# Patient Record
Sex: Male | Born: 1945 | Race: White | Hispanic: No | Marital: Married | Smoking: Former smoker
Health system: Southern US, Community
[De-identification: ages and names within clinical notes are randomized; demographics above are authoritative.]

## PROBLEM LIST (undated history)

## (undated) DIAGNOSIS — N289 Disorder of kidney and ureter, unspecified: Secondary | ICD-10-CM

## (undated) DIAGNOSIS — M109 Gout, unspecified: Secondary | ICD-10-CM

## (undated) DIAGNOSIS — I639 Cerebral infarction, unspecified: Secondary | ICD-10-CM

## (undated) DIAGNOSIS — E119 Type 2 diabetes mellitus without complications: Secondary | ICD-10-CM

## (undated) DIAGNOSIS — I10 Essential (primary) hypertension: Secondary | ICD-10-CM

## (undated) DIAGNOSIS — I2510 Atherosclerotic heart disease of native coronary artery without angina pectoris: Secondary | ICD-10-CM

## (undated) HISTORY — PX: CAROTID STENT: SHX1301

## (undated) HISTORY — PX: HEMORRHOID SURGERY: SHX153

---

## 2002-07-04 ENCOUNTER — Emergency Department (HOSPITAL_COMMUNITY): Admission: EM | Admit: 2002-07-04 | Discharge: 2002-07-04 | Payer: Self-pay | Admitting: Emergency Medicine

## 2010-05-05 ENCOUNTER — Ambulatory Visit: Payer: Self-pay | Admitting: Cardiology

## 2013-11-01 ENCOUNTER — Emergency Department (HOSPITAL_COMMUNITY)
Admission: EM | Admit: 2013-11-01 | Discharge: 2013-11-01 | Disposition: A | Discharge status: Home / Self Care | Payer: Medicare Other | Attending: Emergency Medicine | Admitting: Emergency Medicine

## 2013-11-01 ENCOUNTER — Encounter (HOSPITAL_COMMUNITY): Payer: Self-pay | Admitting: Emergency Medicine

## 2013-11-01 DIAGNOSIS — Z87891 Personal history of nicotine dependence: Secondary | ICD-10-CM | POA: Insufficient documentation

## 2013-11-01 DIAGNOSIS — E119 Type 2 diabetes mellitus without complications: Secondary | ICD-10-CM | POA: Insufficient documentation

## 2013-11-01 DIAGNOSIS — I10 Essential (primary) hypertension: Secondary | ICD-10-CM | POA: Insufficient documentation

## 2013-11-01 DIAGNOSIS — Z87448 Personal history of other diseases of urinary system: Secondary | ICD-10-CM | POA: Insufficient documentation

## 2013-11-01 DIAGNOSIS — I2510 Atherosclerotic heart disease of native coronary artery without angina pectoris: Secondary | ICD-10-CM | POA: Insufficient documentation

## 2013-11-01 HISTORY — DX: Type 2 diabetes mellitus without complications: E11.9

## 2013-11-01 HISTORY — DX: Gout, unspecified: M10.9

## 2013-11-01 HISTORY — DX: Disorder of kidney and ureter, unspecified: N28.9

## 2013-11-01 HISTORY — DX: Essential (primary) hypertension: I10

## 2013-11-01 HISTORY — DX: Atherosclerotic heart disease of native coronary artery without angina pectoris: I25.10

## 2013-11-01 MED ORDER — CLONIDINE HCL 0.1 MG PO TABS
0.1000 mg | ORAL_TABLET | Freq: Once | ORAL | Status: AC
  Administered 2013-11-01: 0.1 mg via ORAL
  Filled 2013-11-01: qty 1

## 2013-11-01 MED ORDER — SODIUM CHLORIDE 0.9 % IV SOLN: Freq: Once | INTRAVENOUS | Status: DC

## 2013-11-01 MED ORDER — CLONIDINE HCL 0.1 MG PO TABS: ORAL_TABLET | ORAL | Status: DC

## 2013-11-01 NOTE — ED Provider Notes (Signed)
CSN: [id number]     Arrival date & time 11/01/13  1740 History   First MD Initiated Contact with Patient 11/01/13 1748    This chart was scribed for Maudry Diego, MD by Anastasia Pall, ED Scribe. This patient was seen in room APA02/APA02 and the patient's care was started at 6:11 PM.  Chief Complaint  Patient presents with  . Hypertension   Patient is a 67 y.o. male presenting with hypertension. The history is provided by the patient. No language interpreter was used.  Hypertension This is a chronic problem. The current episode started 6 to 12 hours ago. The problem occurs constantly. The problem has been gradually improving. Nothing aggravates the symptoms. The symptoms are relieved by medications.   HPI Comments: Aaron Bonilla is a 67 y.o. male with a h/o HTN for 10+ years, who presents to the Emergency Department complaining of a sudden spike in his blood pressure, onset earlier today. He reports he saw his PCP earlier today, who sent him to the ER for HTN evaluation. He reports he was there to have a Rx refill. His BP was reported at 216/114 at the PCP office and was given 50 mg Hydralazine. He denies ever being out of blood pressure medicine. He reports taking Enalopril twice a day. He states while at the PCP they told him they needed to switch blood pressure medicine, but is unsure what they are switching it to. He denies any associated symptoms and any other complaints.  PCP -No primary provider on file.  Past Medical History  Diagnosis Date  . Diabetes mellitus without complication   . Hypertension   . Coronary artery disease   . Renal disorder   . Gout    Past Surgical History  Procedure Laterality Date  . Carotid stent    . Hemorrhoid surgery     No family history on file. History  Substance Use Topics  . Smoking status: Former Research scientist (life sciences)  . Smokeless tobacco: Not on file  . Alcohol Use: No    Review of Systems  Cardiovascular:       Positive for high blood pressure.   All other systems reviewed and are negative.   Allergies  Niacin and related and Oxycodone  Home Medications  No current outpatient prescriptions on file.  Triage Vitals: BP 221/114  Pulse 74  Ht 5\' 11"  (1.803 m)  Wt 209 lb 8 oz (95.029 kg)  BMI 29.23 kg/m2  SpO2 97%  Physical Exam  Constitutional: He is oriented to person, place, and time. He appears well-developed.  HENT:  Head: Normocephalic.  Eyes: Conjunctivae and EOM are normal. No scleral icterus.  Neck: Neck supple. No thyromegaly present.  Cardiovascular: Normal rate and regular rhythm.  Exam reveals no gallop and no friction rub.   No murmur heard. Pulmonary/Chest: No stridor. He has no wheezes. He has no rales. He exhibits no tenderness.  Abdominal: He exhibits no distension. There is no tenderness. There is no rebound.  Musculoskeletal: Normal range of motion. He exhibits no edema.  Lymphadenopathy:    He has no cervical adenopathy.  Neurological: He is oriented to person, place, and time. He exhibits normal muscle tone. Coordination normal.  Skin: No rash noted. No erythema.  Psychiatric: He has a normal mood and affect. His behavior is normal.    ED Course  Procedures (including critical care time)  DIAGNOSTIC STUDIES: Oxygen Saturation is 97% on room air, normal by my interpretation.    COORDINATION OF CARE: 6:14  PM-Discussed treatment plan which includes EKG, Cardiac monitoring, sodium chloride infusion, and Catapres with pt at bedside and pt agreed to plan.   Labs Review Labs Reviewed - No data to display Imaging Review No results found.  EKG Interpretation   None      Meds ordered this encounter  Medications  . 0.9 %  sodium chloride infusion    Sig:   . cloNIDine (CATAPRES) tablet 0.1 mg    Sig:     MDM  No diagnosis found.      The chart was scribed for me under my direct supervision.  I personally performed the history, physical, and medical decision making and all procedures  in the evaluation of this patient.Maudry Diego, MD 11/01/13 2001

## 2013-11-01 NOTE — ED Notes (Signed)
Pt here from PCP for evaluation of hypertension. States he was at the doctors office to get his medication refilled. BP  216/114 office. Was given hydralazine 50 mg in office.

## 2019-12-25 ENCOUNTER — Other Ambulatory Visit (HOSPITAL_COMMUNITY): Payer: Self-pay | Admitting: Ophthalmology

## 2019-12-25 ENCOUNTER — Other Ambulatory Visit: Payer: Self-pay | Admitting: Ophthalmology

## 2019-12-25 DIAGNOSIS — H3582 Retinal ischemia: Secondary | ICD-10-CM

## 2020-01-25 ENCOUNTER — Encounter: Payer: Self-pay | Admitting: Intensive Care

## 2020-01-25 ENCOUNTER — Other Ambulatory Visit: Payer: Self-pay

## 2020-01-25 ENCOUNTER — Emergency Department: Payer: Medicare HMO

## 2020-01-25 ENCOUNTER — Emergency Department
Admission: EM | Admit: 2020-01-25 | Discharge: 2020-01-26 | Disposition: A | Discharge status: Home / Self Care | Payer: Medicare HMO | Attending: Emergency Medicine | Admitting: Emergency Medicine

## 2020-01-25 ENCOUNTER — Ambulatory Visit
Admission: RE | Admit: 2020-01-25 | Discharge: 2020-01-25 | Disposition: A | Discharge status: Home / Self Care | Payer: Medicare HMO | Source: Ambulatory Visit | Attending: Ophthalmology | Admitting: Ophthalmology

## 2020-01-25 DIAGNOSIS — Z79899 Other long term (current) drug therapy: Secondary | ICD-10-CM | POA: Diagnosis not present

## 2020-01-25 DIAGNOSIS — R9389 Abnormal findings on diagnostic imaging of other specified body structures: Secondary | ICD-10-CM | POA: Diagnosis present

## 2020-01-25 DIAGNOSIS — Z794 Long term (current) use of insulin: Secondary | ICD-10-CM | POA: Diagnosis not present

## 2020-01-25 DIAGNOSIS — I6523 Occlusion and stenosis of bilateral carotid arteries: Secondary | ICD-10-CM | POA: Diagnosis not present

## 2020-01-25 DIAGNOSIS — Z20822 Contact with and (suspected) exposure to covid-19: Secondary | ICD-10-CM | POA: Insufficient documentation

## 2020-01-25 DIAGNOSIS — Z7982 Long term (current) use of aspirin: Secondary | ICD-10-CM | POA: Diagnosis not present

## 2020-01-25 DIAGNOSIS — H538 Other visual disturbances: Secondary | ICD-10-CM | POA: Insufficient documentation

## 2020-01-25 DIAGNOSIS — E119 Type 2 diabetes mellitus without complications: Secondary | ICD-10-CM | POA: Insufficient documentation

## 2020-01-25 DIAGNOSIS — I2510 Atherosclerotic heart disease of native coronary artery without angina pectoris: Secondary | ICD-10-CM | POA: Diagnosis not present

## 2020-01-25 DIAGNOSIS — Z8673 Personal history of transient ischemic attack (TIA), and cerebral infarction without residual deficits: Secondary | ICD-10-CM | POA: Diagnosis not present

## 2020-01-25 DIAGNOSIS — H3582 Retinal ischemia: Secondary | ICD-10-CM

## 2020-01-25 DIAGNOSIS — I10 Essential (primary) hypertension: Secondary | ICD-10-CM | POA: Diagnosis not present

## 2020-01-25 DIAGNOSIS — Z87891 Personal history of nicotine dependence: Secondary | ICD-10-CM | POA: Diagnosis not present

## 2020-01-25 HISTORY — DX: Cerebral infarction, unspecified: I63.9

## 2020-01-25 LAB — CBC WITH DIFFERENTIAL/PLATELET
Abs Immature Granulocytes: 0.04 10*3/uL (ref 0.00–0.07)
Basophils Absolute: 0 10*3/uL (ref 0.0–0.1)
Basophils Relative: 0 %
Eosinophils Absolute: 0.2 10*3/uL (ref 0.0–0.5)
Eosinophils Relative: 2 %
HCT: 36.5 % — ABNORMAL LOW (ref 39.0–52.0)
Hemoglobin: 12.4 g/dL — ABNORMAL LOW (ref 13.0–17.0)
Immature Granulocytes: 0 %
Lymphocytes Relative: 21 %
Lymphs Abs: 2 10*3/uL (ref 0.7–4.0)
MCH: 27 pg (ref 26.0–34.0)
MCHC: 34 g/dL (ref 30.0–36.0)
MCV: 79.3 fL — ABNORMAL LOW (ref 80.0–100.0)
Monocytes Absolute: 0.4 10*3/uL (ref 0.1–1.0)
Monocytes Relative: 4 %
Neutro Abs: 7 10*3/uL (ref 1.7–7.7)
Neutrophils Relative %: 73 %
Platelets: 312 10*3/uL (ref 150–400)
RBC: 4.6 MIL/uL (ref 4.22–5.81)
RDW: 15.3 % (ref 11.5–15.5)
WBC: 9.7 10*3/uL (ref 4.0–10.5)
nRBC: 0 % (ref 0.0–0.2)

## 2020-01-25 LAB — COMPREHENSIVE METABOLIC PANEL
ALT: 15 U/L (ref 0–44)
AST: 17 U/L (ref 15–41)
Albumin: 3.4 g/dL — ABNORMAL LOW (ref 3.5–5.0)
Alkaline Phosphatase: 60 U/L (ref 38–126)
Anion gap: 14 (ref 5–15)
BUN: 37 mg/dL — ABNORMAL HIGH (ref 8–23)
CO2: 19 mmol/L — ABNORMAL LOW (ref 22–32)
Calcium: 8.5 mg/dL — ABNORMAL LOW (ref 8.9–10.3)
Chloride: 104 mmol/L (ref 98–111)
Creatinine, Ser: 2.58 mg/dL — ABNORMAL HIGH (ref 0.61–1.24)
GFR calc Af Amer: 27 mL/min — ABNORMAL LOW (ref >60)
GFR calc non Af Amer: 23 mL/min — ABNORMAL LOW (ref >60)
Glucose, Bld: 192 mg/dL — ABNORMAL HIGH (ref 70–99)
Potassium: 3.3 mmol/L — ABNORMAL LOW (ref 3.5–5.1)
Sodium: 137 mmol/L (ref 135–145)
Total Bilirubin: 0.5 mg/dL (ref 0.3–1.2)
Total Protein: 7.2 g/dL (ref 6.5–8.1)

## 2020-01-25 LAB — PROTIME-INR
INR: 1 (ref 0.8–1.2)
Prothrombin Time: 12.6 seconds (ref 11.4–15.2)

## 2020-01-25 LAB — APTT: aPTT: 34 seconds (ref 24–36)

## 2020-01-25 MED ORDER — CLOPIDOGREL BISULFATE 75 MG PO TABS
75.0000 mg | ORAL_TABLET | Freq: Once | ORAL | Status: AC
  Administered 2020-01-25: 23:00:00 75 mg via ORAL
  Filled 2020-01-25: qty 1

## 2020-01-25 MED ORDER — CLOPIDOGREL BISULFATE 75 MG PO TABS: 75.0000 mg | ORAL_TABLET | Freq: Every day | ORAL | 2 refills | Status: AC

## 2020-01-25 MED ORDER — LABETALOL HCL 5 MG/ML IV SOLN
20.0000 mg | Freq: Once | INTRAVENOUS | Status: AC
  Administered 2020-01-25: 22:00:00 20 mg via INTRAVENOUS
  Filled 2020-01-25: qty 4

## 2020-01-25 MED ORDER — SODIUM CHLORIDE 0.9 % IV BOLUS
1000.0000 mL | Freq: Once | INTRAVENOUS | Status: AC
  Administered 2020-01-25: 22:00:00 1000 mL via INTRAVENOUS

## 2020-01-25 MED ORDER — LABETALOL HCL 5 MG/ML IV SOLN: 20.0000 mg | Freq: Once | INTRAVENOUS | Status: DC

## 2020-01-25 NOTE — ED Notes (Signed)
Pt was taken to CT

## 2020-01-25 NOTE — ED Triage Notes (Addendum)
Patient sent by PCP after remarkable outpatient [place] of bilateral carotids. Denies any symptoms

## 2020-01-25 NOTE — ED Provider Notes (Signed)
[facility] Emergency Department Provider Note   ____________________________________________   First MD Initiated Contact with Patient 01/25/20 [phone]     (approximate)  I have reviewed the triage vital signs and the nursing notes.   HISTORY  Chief Complaint Carotid    HPI Aaron Bonilla is a 74 y.o. male with past medical history of CAD, hypertension, diabetes who presents to the ED for abnormal imaging results.  Patient reports that he was referred by his ophthalmologist to get an ultrasound of the blood vessels in his neck.  This was performed earlier today and concerning for occlusion of his right internal carotid artery and significant stenosis of his left internal carotid artery.  He was then told he needed to seek care in the ED.  He states he has been having a few months of blurry vision in his left eye, but denies any current numbness, weakness, or speech changes.  He does admit to an episode a few months ago where he had slurred speech and left-sided weakness, but did not seek care at that time.  He currently takes aspirin, but denies any other blood thinners.  He states he has been taking medication for his blood pressure but that it always seems to run around the 200s.        Past Medical History:  Diagnosis Date  . Coronary artery disease   . Diabetes mellitus without complication ([place])   . Gout   . Hypertension   . Renal disorder   . Stroke [facility])     There are no problems to display for this patient.   Past Surgical History:  Procedure Laterality Date  . CAROTID STENT    . HEMORRHOID SURGERY      Prior to Admission medications   Medication Sig Start Date End Date Taking? Authorizing Provider  amLODipine (NORVASC) 10 MG tablet Take 10 mg by mouth daily.    Yes [provider]  aspirin EC 81 MG tablet Take 81 mg by mouth daily.   Yes [provider]  atorvastatin (LIPITOR) 80 MG tablet Take 80 mg by mouth at  bedtime. 11/01/13  Yes [provider]  Cholecalciferol (VITAMIN D) 50 MCG (2000 UT) CAPS Take 2,000 Units by mouth daily.   Yes [provider]  cyanocobalamin 2000 MCG tablet Take 2,000 mcg by mouth every evening.   Yes [provider]  glipiZIDE (GLUCOTROL XL) 10 MG 24 hr tablet Take 10 mg by mouth 2 (two) times daily.   Yes [provider]  insulin glargine (LANTUS) 100 UNIT/ML injection Inject 60 Units into the skin 2 (two) times daily.   Yes [provider]  lisinopril (ZESTRIL) 40 MG tablet Take 40 mg by mouth daily.   Yes [provider]  metFORMIN (GLUCOPHAGE) 1000 MG tablet Take 1,000 mg by mouth 2 (two) times daily.    Yes [provider]  metoprolol (TOPROL-XL) 200 MG 24 hr tablet Take 200 mg by mouth daily.   Yes [provider]  omeprazole (PRILOSEC) 20 MG capsule Take 20 mg by mouth daily.   Yes [provider]  pregabalin (LYRICA) 75 MG capsule Take 75 mg by mouth 2 (two) times daily.   Yes [provider]  clopidogrel (PLAVIX) 75 MG tablet Take 1 tablet (75 mg total) by mouth daily. 01/25/20 04/24/20  Blake Divine, MD    Allergies Niacin and related and Oxycodone  History reviewed. No pertinent family history.  Social History Social History  Tobacco Use  . Smoking status: Former Smoker    Types: Cigarettes, Cigars  . Smokeless tobacco: Never Used  Substance Use Topics  . Alcohol use: No  . Drug use: No    Review of Systems  Constitutional: No fever/chills Eyes: Positive for visual changes. ENT: No sore throat. Cardiovascular: Denies chest pain. Respiratory: Denies shortness of breath. Gastrointestinal: No abdominal pain.  No nausea, no vomiting.  No diarrhea.  No constipation. Genitourinary: Negative for dysuria. Musculoskeletal: Negative for back pain. Skin: Negative for rash. Neurological: Negative for headaches, focal weakness or  numbness.  ____________________________________________   PHYSICAL EXAM:  VITAL SIGNS: ED Triage Vitals  Enc Vitals Group     BP 01/25/20 1839 (!) 227/117     Pulse Rate 01/25/20 1839 89     Resp 01/25/20 1839 16     Temp 01/25/20 1839 97.9 F (36.6 C)     Temp Source 01/25/20 1839 Oral     SpO2 01/25/20 1839 97 %     Weight 01/25/20 1720 230 lb (104.3 kg)     Height 01/25/20 1720 5\' 11"  (1.803 m)     Head Circumference --      Peak Flow --      Pain Score 01/25/20 1719 0     Pain Loc --      Pain Edu? --      Excl. in [place]? --     Constitutional: Alert and oriented. Eyes: Conjunctivae are normal. Head: Atraumatic. Nose: No congestion/rhinnorhea. Mouth/Throat: Mucous membranes are moist. Neck: Normal ROM Cardiovascular: Normal rate, regular rhythm. Grossly normal heart sounds. Respiratory: Normal respiratory effort.  No retractions. Lungs CTAB. Gastrointestinal: Soft and nontender. No distention. Genitourinary: deferred Musculoskeletal: No lower extremity tenderness nor edema. Neurologic:  Normal speech and language. No gross focal neurologic deficits are appreciated. Skin:  Skin is warm, dry and intact. No rash noted. Psychiatric: Mood and affect are normal. Speech and behavior are normal.  ____________________________________________   LABS (all labs ordered are listed, but only abnormal results are displayed)  Labs Reviewed  CBC WITH DIFFERENTIAL/PLATELET - Abnormal; Notable for the following components:      Result Value   Hemoglobin 12.4 (*)    HCT 36.5 (*)    MCV 79.3 (*)    All other components within normal limits  COMPREHENSIVE METABOLIC PANEL - Abnormal; Notable for the following components:   Potassium 3.3 (*)    CO2 19 (*)    Glucose, Bld 192 (*)    BUN 37 (*)    Creatinine, Ser 2.58 (*)    Calcium 8.5 (*)    Albumin 3.4 (*)    GFR calc non Af Amer 23 (*)    GFR calc Af Amer 27 (*)    All other components within normal limits  SARS  CORONAVIRUS 2 (TAT 6-24 HRS)  APTT  PROTIME-INR   ____________________________________________  EKG  ED ECG REPORT I, Blake Divine, the attending physician, personally viewed and interpreted this ECG.   Date: 01/25/2020  EKG Time: 19:33  Rate: 88  Rhythm: normal sinus rhythm  Axis: Normal  Intervals:none  ST&T Change: Nonspecific T wave changes   PROCEDURES  Procedure(s) performed (including Critical Care):  Procedures   ____________________________________________   INITIAL IMPRESSION / ASSESSMENT AND PLAN / ED COURSE       74 year old male with history of hypertension presents to the ED after outpatient ultrasound showed occlusion of his right internal carotid and significant stenosis of his left internal carotid.  He has had some blurry vision in his left eye for the past few months and also reports a TIA like episode about 5 months ago, but he does not have an apparent focal deficits on exam today.  CT head is negative for acute process.  Case was discussed with vascular surgeon, who agrees patient should be started on Plavix but no indication for acute surgical intervention.  Patient has an elevated creatinine with no prior comparison in our system, also significantly hypertensive here.  Case was discussed with hospitalist, Dr. Si Raider, who was able to look up patient's records from recent PCP visits.  Based on this information, patient's kidney function appears stable and he is known to have significant hypertension chronically.  Given there is no hypertensive emergency, patient does not require admission and may follow-up with vascular surgery early next week.  I have counseled him to return to the ED for any new or worsening neurologic symptoms, he will be prescribed Plavix and given an initial dose here in the ED.  Patient agrees with plan.      ____________________________________________   FINAL CLINICAL IMPRESSION(S) / ED DIAGNOSES  Final diagnoses:  Bilateral  carotid artery stenosis  Blurry vision, left eye  Essential hypertension     ED Discharge Orders         Ordered    clopidogrel (PLAVIX) 75 MG tablet  Daily     01/25/20 2311           Note:  This document was prepared using Dragon voice recognition software and may include unintentional dictation errors.   Blake Divine, MD 01/26/20 0005

## 2020-01-25 NOTE — ED Notes (Signed)
Pt has something to drink pt has no complaints at this time and is resting in bed.

## 2020-01-25 NOTE — ED Notes (Signed)
Pt being discharged and was updated by the EDP. Pt calling family for a ride at this time. Pt unsteady with ambulation and will wait in the room until his ride arrives

## 2020-01-25 NOTE — ED Notes (Signed)
MD at bedside to update patient

## 2020-01-25 NOTE — Consult Note (Deleted)
error 

## 2020-01-25 NOTE — ED Notes (Signed)
Per MD Charna Archer, orders placed

## 2020-01-26 LAB — SARS CORONAVIRUS 2 (TAT 6-24 HRS): SARS Coronavirus 2: NEGATIVE

## 2020-03-25 ENCOUNTER — Other Ambulatory Visit: Payer: Self-pay

## 2020-03-25 ENCOUNTER — Ambulatory Visit (INDEPENDENT_AMBULATORY_CARE_PROVIDER_SITE_OTHER): Payer: Medicare HMO | Admitting: Vascular Surgery

## 2020-03-25 ENCOUNTER — Encounter: Payer: Self-pay | Admitting: Vascular Surgery

## 2020-03-25 DIAGNOSIS — I6522 Occlusion and stenosis of left carotid artery: Secondary | ICD-10-CM

## 2020-03-25 DIAGNOSIS — I779 Disorder of arteries and arterioles, unspecified: Secondary | ICD-10-CM | POA: Insufficient documentation

## 2020-03-25 NOTE — Progress Notes (Signed)
Patient name: Aaron Bonilla MRN: [id number] DOB: Sep 30, 1946 Sex: male  REASON FOR CONSULT: Evaluate carotid disease  HPI: Aaron Bonilla is a 74 y.o. male, history of coronary artery disease, diabetes, hypertension the presents for evaluation of carotid artery disease.  He was seen in the ED in February at the direction of his opthalmologist with left-sided visual changes the describes as blurred vision.  States at time feels likes there are worms in his visual field.  Apparently he was told by his ophthalmologist to go to the ED.  On evaluation in the ED had an ultrasound as well as a CT of his head.  US showed age indeterminate right ICA occlusion and left ICA 50-69% stenosis extending to mid ICA.  He reports no previous strokes or TIAs.  He reports only some left-sided arm weakness when he had heart attack in 2004.  No head or neck surgery.  States at times his right eye is affected as well.  Past Medical History:  Diagnosis Date  . Coronary artery disease   . Diabetes mellitus without complication ([place])   . Gout   . Hypertension   . Renal disorder   . Stroke [facility])     Past Surgical History:  Procedure Laterality Date  . CAROTID STENT    . HEMORRHOID SURGERY      Family History  Problem Relation Age of Onset  . Diabetes Mother   . Heart disease Mother   . Hypertension Mother   . Diabetes Father   . Heart disease Father   . Hypertension Father   . Cancer Sister     SOCIAL HISTORY: Social History   Socioeconomic History  . Marital status: Married    Spouse name: Not on file  . Number of children: Not on file  . Years of education: Not on file  . Highest education level: Not on file  Occupational History  . Not on file  Tobacco Use  . Smoking status: Former Smoker    Types: Cigarettes, Cigars  . Smokeless tobacco: Never Used  Substance and Sexual Activity  . Alcohol use: No  . Drug use: No  . Sexual activity: Not on file  Other Topics Concern  . Not on file   Social History Narrative  . Not on file   Social Determinants of Health   Financial Resource Strain:   . Difficulty of Paying Living Expenses:   Food Insecurity:   . Worried About Charity fundraiser in the Last Year:   . Arboriculturist in the Last Year:   Transportation Needs:   . Film/video editor (Medical):   Marland Kitchen Lack of Transportation (Non-Medical):   Physical Activity:   . Days of Exercise per Week:   . Minutes of Exercise per Session:   Stress:   . Feeling of Stress :   Social Connections:   . Frequency of Communication with Friends and Family:   . Frequency of Social Gatherings with Friends and Family:   . Attends Religious Services:   . Active Member of Clubs or Organizations:   . Attends Archivist Meetings:   Marland Kitchen Marital Status:   Intimate Partner Violence:   . Fear of Current or Ex-Partner:   . Emotionally Abused:   Marland Kitchen Physically Abused:   . Sexually Abused:     Allergies  Allergen Reactions  . Hydrochlorothiazide Other (See Comments)  . Niacin Hives  . Niacin And Related Hives  . Oxycodone Nausea  Only    Current Outpatient Medications  Medication Sig Dispense Refill  . amLODipine (NORVASC) 10 MG tablet Take 10 mg by mouth daily.     Marland Kitchen aspirin EC 81 MG tablet Take 81 mg by mouth daily.    Marland Kitchen atorvastatin (LIPITOR) 80 MG tablet Take 80 mg by mouth at bedtime.    . Cholecalciferol (VITAMIN D) 50 MCG (2000 UT) CAPS Take 2,000 Units by mouth daily.    . cyanocobalamin 2000 MCG tablet Take 2,000 mcg by mouth every evening.    Marland Kitchen glipiZIDE (GLUCOTROL XL) 10 MG 24 hr tablet Take 10 mg by mouth 2 (two) times daily.    . insulin glargine (LANTUS) 100 UNIT/ML injection Inject 60 Units into the skin 2 (two) times daily.    Marland Kitchen lisinopril (ZESTRIL) 40 MG tablet Take 40 mg by mouth daily.    . metFORMIN (GLUCOPHAGE) 1000 MG tablet Take 1,000 mg by mouth 2 (two) times daily.     . metoprolol (TOPROL-XL) 200 MG 24 hr tablet Take 200 mg by mouth daily.    Marland Kitchen  omeprazole (PRILOSEC) 20 MG capsule Take 20 mg by mouth daily.    . pregabalin (LYRICA) 75 MG capsule Take 75 mg by mouth 2 (two) times daily.    . clopidogrel (PLAVIX) 75 MG tablet Take 1 tablet (75 mg total) by mouth daily. (Patient not taking: Reported on 03/25/2020) 30 tablet 2   No current facility-administered medications for this visit.    REVIEW OF SYSTEMS:  [X]  denotes positive finding, [ ]  denotes negative finding Cardiac  Comments:  Chest pain or chest pressure:    Shortness of breath upon exertion:    Short of breath when lying flat:    Irregular heart rhythm:        Vascular    Pain in calf, thigh, or hip brought on by ambulation:    Pain in feet at night that wakes you up from your sleep:     Blood clot in your veins:    Leg swelling:         Pulmonary    Oxygen at home:    Productive cough:     Wheezing:         Neurologic    Sudden weakness in arms or legs:     Sudden numbness in arms or legs:     Sudden onset of difficulty speaking or slurred speech:    Temporary loss of vision in one eye:     Problems with dizziness:         Gastrointestinal    Blood in stool:     Vomited blood:         Genitourinary    Burning when urinating:     Blood in urine:        Psychiatric    Major depression:         Hematologic    Bleeding problems:    Problems with blood clotting too easily:        Skin    Rashes or ulcers:        Constitutional    Fever or chills:      PHYSICAL EXAM: Vitals:   03/25/20 1413 03/25/20 1423  BP: (!) 172/98 (!) 167/91  Pulse: 77 77  Resp: 20   Temp: 97.9 F (36.6 C)   TempSrc: Temporal   Weight: 230 lb (104.3 kg)   Height: 5\' 11"  (1.803 m)     GENERAL: The patient is a  well-nourished male, in no acute distress. The vital signs are documented above. CARDIAC: There is a regular rate and rhythm.  VASCULAR:  Palpable radial femoral dorsalis pedis pulses bilaterally PULMONARY: There is good air exchange bilaterally without  wheezing or rales. ABDOMEN: Soft and non-tender with normal pitched bowel sounds.  MUSCULOSKELETAL: There are no major deformities or cyanosis. NEUROLOGIC: No focal weakness or paresthesias are detected.  CN II-XII grossly intact. SKIN: There are no ulcers or rashes noted. PSYCHIATRIC: The patient has a normal affect.  DATA:   Ultrasound shows right ICA occlusion and left ICA moderate stenosis in 50-69% range extending to mid ICA.  CT head normal from 01/25/20.  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  He has been having vague blurry vision in the left eye (and right eye at times as well) and it does not sound like true amaurosis.  I do not have any notes from his ophthalmologist.  Discussed when ICA is occluded no role for revascularization, especially given chronic nature at this time.   I would prefer to get a CTA neck but due to his renal function will have to get a carotid angiogram to see if he needs any surgical intervention.  Carotid velocities can be over-estimated with contralateral occlusion and with duplex suggesting lesion extends to mid ICA want to ensure lesion is accessible.    Marty Heck, MD [facility] Office: [phone]

## 2020-04-01 ENCOUNTER — Other Ambulatory Visit: Payer: Self-pay

## 2020-04-01 ENCOUNTER — Other Ambulatory Visit (HOSPITAL_COMMUNITY)
Admission: RE | Admit: 2020-04-01 | Discharge: 2020-04-01 | Disposition: A | Discharge status: Home / Self Care | Payer: Medicare HMO | Source: Ambulatory Visit | Attending: Vascular Surgery | Admitting: Vascular Surgery

## 2020-04-01 DIAGNOSIS — Z01812 Encounter for preprocedural laboratory examination: Secondary | ICD-10-CM | POA: Insufficient documentation

## 2020-04-01 DIAGNOSIS — Z20822 Contact with and (suspected) exposure to covid-19: Secondary | ICD-10-CM | POA: Insufficient documentation

## 2020-04-02 ENCOUNTER — Other Ambulatory Visit: Payer: Self-pay

## 2020-04-02 ENCOUNTER — Ambulatory Visit (HOSPITAL_COMMUNITY)
Admission: RE | Admit: 2020-04-02 | Discharge: 2020-04-02 | Disposition: A | Discharge status: Home / Self Care | Payer: Medicare HMO | Attending: Vascular Surgery | Admitting: Vascular Surgery

## 2020-04-02 ENCOUNTER — Encounter (HOSPITAL_COMMUNITY)
Admission: RE | Disposition: A | Discharge status: Home / Self Care | Payer: Self-pay | Source: Home / Self Care | Attending: Vascular Surgery

## 2020-04-02 DIAGNOSIS — Z885 Allergy status to narcotic agent status: Secondary | ICD-10-CM | POA: Diagnosis not present

## 2020-04-02 DIAGNOSIS — M109 Gout, unspecified: Secondary | ICD-10-CM | POA: Insufficient documentation

## 2020-04-02 DIAGNOSIS — I6522 Occlusion and stenosis of left carotid artery: Secondary | ICD-10-CM

## 2020-04-02 DIAGNOSIS — Z87891 Personal history of nicotine dependence: Secondary | ICD-10-CM | POA: Diagnosis not present

## 2020-04-02 DIAGNOSIS — Z794 Long term (current) use of insulin: Secondary | ICD-10-CM | POA: Insufficient documentation

## 2020-04-02 DIAGNOSIS — Z7902 Long term (current) use of antithrombotics/antiplatelets: Secondary | ICD-10-CM | POA: Insufficient documentation

## 2020-04-02 DIAGNOSIS — I2510 Atherosclerotic heart disease of native coronary artery without angina pectoris: Secondary | ICD-10-CM | POA: Diagnosis not present

## 2020-04-02 DIAGNOSIS — Z7982 Long term (current) use of aspirin: Secondary | ICD-10-CM | POA: Insufficient documentation

## 2020-04-02 DIAGNOSIS — Z8673 Personal history of transient ischemic attack (TIA), and cerebral infarction without residual deficits: Secondary | ICD-10-CM | POA: Diagnosis not present

## 2020-04-02 DIAGNOSIS — E119 Type 2 diabetes mellitus without complications: Secondary | ICD-10-CM | POA: Insufficient documentation

## 2020-04-02 DIAGNOSIS — I252 Old myocardial infarction: Secondary | ICD-10-CM | POA: Insufficient documentation

## 2020-04-02 DIAGNOSIS — I6523 Occlusion and stenosis of bilateral carotid arteries: Secondary | ICD-10-CM | POA: Insufficient documentation

## 2020-04-02 DIAGNOSIS — I10 Essential (primary) hypertension: Secondary | ICD-10-CM | POA: Diagnosis not present

## 2020-04-02 DIAGNOSIS — Z79899 Other long term (current) drug therapy: Secondary | ICD-10-CM | POA: Insufficient documentation

## 2020-04-02 HISTORY — PX: CAROTID ANGIOGRAPHY: CATH118230

## 2020-04-02 LAB — POCT I-STAT, CHEM 8
BUN: 37 mg/dL — ABNORMAL HIGH (ref 8–23)
Calcium, Ion: 1.24 mmol/L (ref 1.15–1.40)
Chloride: 109 mmol/L (ref 98–111)
Creatinine, Ser: 2.8 mg/dL — ABNORMAL HIGH (ref 0.61–1.24)
Glucose, Bld: 101 mg/dL — ABNORMAL HIGH (ref 70–99)
HCT: 40 % (ref 39.0–52.0)
Hemoglobin: 13.6 g/dL (ref 13.0–17.0)
Potassium: 4.1 mmol/L (ref 3.5–5.1)
Sodium: 140 mmol/L (ref 135–145)
TCO2: 23 mmol/L (ref 22–32)

## 2020-04-02 LAB — GLUCOSE, CAPILLARY: Glucose-Capillary: 116 mg/dL — ABNORMAL HIGH (ref 70–99)

## 2020-04-02 LAB — SARS CORONAVIRUS 2 (TAT 6-24 HRS): SARS Coronavirus 2: NEGATIVE

## 2020-04-02 SURGERY — CAROTID ANGIOGRAPHY
Anesthesia: LOCAL

## 2020-04-02 MED ORDER — HYDRALAZINE HCL 20 MG/ML IJ SOLN: 5.0000 mg | INTRAMUSCULAR | Status: DC | PRN

## 2020-04-02 MED ORDER — DIPHENHYDRAMINE HCL 50 MG/ML IJ SOLN
INTRAMUSCULAR | Status: AC
  Filled 2020-04-02: qty 1

## 2020-04-02 MED ORDER — LIDOCAINE HCL (PF) 1 % IJ SOLN
INTRAMUSCULAR | Status: AC
  Filled 2020-04-02: qty 30

## 2020-04-02 MED ORDER — IODIXANOL 320 MG/ML IV SOLN
INTRAVENOUS | Status: DC | PRN
  Administered 2020-04-02: 70 mL via INTRA_ARTERIAL

## 2020-04-02 MED ORDER — HEPARIN (PORCINE) IN NACL 1000-0.9 UT/500ML-% IV SOLN
INTRAVENOUS | Status: AC
  Filled 2020-04-02: qty 1000

## 2020-04-02 MED ORDER — LABETALOL HCL 5 MG/ML IV SOLN
INTRAVENOUS | Status: AC
  Filled 2020-04-02: qty 4

## 2020-04-02 MED ORDER — SODIUM CHLORIDE 0.9 % IV SOLN: 250.0000 mL | INTRAVENOUS | Status: DC | PRN

## 2020-04-02 MED ORDER — ONDANSETRON HCL 4 MG/2ML IJ SOLN: 4.0000 mg | Freq: Four times a day (QID) | INTRAMUSCULAR | Status: DC | PRN

## 2020-04-02 MED ORDER — LIDOCAINE HCL (PF) 1 % IJ SOLN
INTRAMUSCULAR | Status: DC | PRN
  Administered 2020-04-02: 20 mL via INTRADERMAL

## 2020-04-02 MED ORDER — METHYLPREDNISOLONE SODIUM SUCC 125 MG IJ SOLR
INTRAMUSCULAR | Status: DC | PRN
  Administered 2020-04-02: 125 mg via INTRAVENOUS

## 2020-04-02 MED ORDER — HEPARIN SODIUM (PORCINE) 1000 UNIT/ML IJ SOLN
INTRAMUSCULAR | Status: DC | PRN
  Administered 2020-04-02: 3000 [IU] via INTRAVENOUS

## 2020-04-02 MED ORDER — SODIUM CHLORIDE 0.9% FLUSH: 3.0000 mL | INTRAVENOUS | Status: DC | PRN

## 2020-04-02 MED ORDER — SODIUM CHLORIDE 0.9 % IV SOLN: INTRAVENOUS | Status: DC

## 2020-04-02 MED ORDER — SODIUM CHLORIDE 0.9% FLUSH: 3.0000 mL | Freq: Two times a day (BID) | INTRAVENOUS | Status: DC

## 2020-04-02 MED ORDER — DIPHENHYDRAMINE HCL 50 MG/ML IJ SOLN
INTRAMUSCULAR | Status: DC | PRN
  Administered 2020-04-02: 50 mg via INTRAVENOUS

## 2020-04-02 MED ORDER — HEPARIN SODIUM (PORCINE) 1000 UNIT/ML IJ SOLN
INTRAMUSCULAR | Status: AC
  Filled 2020-04-02: qty 1

## 2020-04-02 MED ORDER — METHYLPREDNISOLONE SODIUM SUCC 125 MG IJ SOLR
INTRAMUSCULAR | Status: AC
  Filled 2020-04-02: qty 2

## 2020-04-02 MED ORDER — LABETALOL HCL 5 MG/ML IV SOLN
10.0000 mg | INTRAVENOUS | Status: DC | PRN
  Administered 2020-04-02: 10 mg via INTRAVENOUS

## 2020-04-02 MED ORDER — HYDRALAZINE HCL 20 MG/ML IJ SOLN
INTRAMUSCULAR | Status: AC
  Filled 2020-04-02: qty 1

## 2020-04-02 MED ORDER — HYDRALAZINE HCL 20 MG/ML IJ SOLN
INTRAMUSCULAR | Status: DC | PRN
  Administered 2020-04-02 (×2): 10 mg via INTRAVENOUS

## 2020-04-02 MED ORDER — HEPARIN (PORCINE) IN NACL 1000-0.9 UT/500ML-% IV SOLN
INTRAVENOUS | Status: DC | PRN
  Administered 2020-04-02 (×2): 500 mL

## 2020-04-02 MED ORDER — ACETAMINOPHEN 325 MG PO TABS: 650.0000 mg | ORAL_TABLET | ORAL | Status: DC | PRN

## 2020-04-02 SURGICAL SUPPLY — 13 items
CATH ANGIO 5F PIGTAIL 100CM (CATHETERS) ×1 IMPLANT
CLOSURE MYNX CONTROL 5F (Vascular Products) ×1 IMPLANT
GLIDEWIRE NITREX 0.018X80X5 (WIRE) ×4
GUIDEWIRE NITREX 0.018X80X5 (WIRE) IMPLANT
KIT MICROPUNCTURE NIT STIFF (SHEATH) ×1 IMPLANT
KIT PV (KITS) ×2 IMPLANT
SHEATH PINNACLE 5F 10CM (SHEATH) ×1 IMPLANT
SHEATH PROBE COVER 6X72 (BAG) ×1 IMPLANT
SYR MEDRAD MARK V 150ML (SYRINGE) ×1 IMPLANT
TRANSDUCER W/STOPCOCK (MISCELLANEOUS) ×2 IMPLANT
TRAY PV CATH (CUSTOM PROCEDURE TRAY) ×2 IMPLANT
WIRE BENTSON .035X145CM (WIRE) ×1 IMPLANT
WIRE ROSEN-J .035X180CM (WIRE) ×1 IMPLANT

## 2020-04-02 NOTE — Progress Notes (Signed)
Pt leaves cath lab holding area in stable condition. Rt groin is unremarkable. Site is CDI. Rt dp/pt 2+.

## 2020-04-02 NOTE — Discharge Instructions (Signed)
We will arrange follow-up in 6 months with carotid duplex in our office.  I see no indication for carotid intervention at this time.  Left carotid stenosis less than 50%.  Continue aspirin, plavix, statin for medical management.      Femoral Site Care This sheet gives you information about how to care for yourself after your procedure. Your health care provider may also give you more specific instructions. If you have problems or questions, contact your health care provider. What can I expect after the procedure? After the procedure, it is common to have:  Bruising that usually fades within 1-2 weeks.  Tenderness at the site. Follow these instructions at home: Wound care  Follow instructions from your health care provider about how to take care of your insertion site. Make sure you: ? Wash your hands with soap and water before you change your bandage (dressing). If soap and water are not available, use hand sanitizer. ? Change your dressing as told by your health care provider. ? Leave stitches (sutures), skin glue, or adhesive strips in place. These skin closures may need to stay in place for 2 weeks or longer. If adhesive strip edges start to loosen and curl up, you may trim the loose edges. Do not remove adhesive strips completely unless your health care provider tells you to do that.  Do not take baths, swim, or use a hot tub until your health care provider approves.  You may shower 24-48 hours after the procedure or as told by your health care provider. ? Gently wash the site with plain soap and water. ? Pat the area dry with a clean towel. ? Do not rub the site. This may cause bleeding.  Do not apply powder or lotion to the site. Keep the site clean and dry.  Check your femoral site every day for signs of infection. Check for: ? Redness, swelling, or pain. ? Fluid or blood. ? Warmth. ? Pus or a bad smell. Activity  For the first 2-3 days after your procedure,  or as long as directed: ? Avoid climbing stairs as much as possible. ? Do not squat.  Do not lift anything that is heavier than 10 lb (4.5 kg), or the limit that you are told, until your health care provider says that it is safe.  Rest as directed. ? Avoid sitting for a long time without moving. Get up to take short walks every 1-2 hours.  Do not drive for 24 hours if you were given a medicine to help you relax (sedative). General instructions  Take over-the-counter and prescription medicines only as told by your health care provider.  Keep all follow-up visits as told by your health care provider. This is important. Contact a health care provider if you have:  A fever or chills.  You have redness, swelling, or pain around your insertion site. Get help right away if:  The catheter insertion area swells very fast.  You pass out.  You suddenly start to sweat or your skin gets clammy.  The catheter insertion area is bleeding, and the bleeding does not stop when you hold steady pressure on the area.  The area near or just beyond the catheter insertion site becomes pale, cool, tingly, or numb. These symptoms may represent a serious problem that is an emergency. Do not wait to see if the symptoms will go away. Get medical help right away. Call your local emergency services (911 in the [place]  Do not drive yourself to the hospital. Summary  After the procedure, it is common to have bruising that usually fades within 1-2 weeks.  Check your femoral site every day for signs of infection.  Do not lift anything that is heavier than 10 lb (4.5 kg), or the limit that you are told, until your health care provider says that it is safe. This information is not intended to replace advice given to you by your health care provider. Make sure you discuss any questions you have with your health care provider. Document Revised: 12/19/2017 Document Reviewed: 12/19/2017 Elsevier Patient Education   2020 [employer].

## 2020-04-02 NOTE — Op Note (Signed)
    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  Ultrasound-guided access of the right common femoral artery 2.  Limited thoracic arch aortogram 3.  Left carotid angiogram 4.  Mynx closure of the right common femoral artery  Indications: 74 year old male that was seen in clinic recently with concern for significant left ICA stenosis in the setting of right ICA occlusion.  He has been having vague visual symptoms and it was unclear if he would benefit from carotid endarterectomy.  Due to significant underlying chronic kidney disease we elected to perform carotid angiogram to limit his contrast.  Risk and benefits are discussed.  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  The left internal carotid artery is calcified but has less than 50% stenosis and is extremely tortuous just below the mandible.  The right internal artery is chronically occluded as previously noted.   Procedure:  The patient was identified in the holding area and taken to room 8.  The patient was then placed supine on the table and prepped and draped in the usual sterile fashion.  A time out was called.  Ultrasound was used to evaluate the right common femoral artery.  It was patent .  A digital ultrasound image was acquired.  A micropuncture needle was used to access the right common femoral artery under ultrasound guidance.  An 018 wire was advanced without resistance and a micropuncture sheath was placed.  The 018 wire was removed and a benson wire was placed.  The micropuncture sheath was exchanged for a 5 french sheath.  Pigtail catheter was advanced into the aortic arch over a Bentson wire.  Ultimately this was placed in the ascending aorta.  We initially  performed a limited thoracic aortogram to try and include his carotid bifurcation and the left neck given his underlying chronic kidney disease.  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  We then advanced the catheter further into the ascending aorta and got a second picture.  Pertinent findings are noted above but he has a very tortuous left internal carotid artery that likely accounts for the elevated velocity with a contralateral occlusion.  I do not see any evidence of more than 50% stenosis that would indicate any reason that he should have surgery at this time.  At that point time he had an apparent contrast reaction so he was given Benadryl and steroids.  Wires and catheters were removed.  A mynx closure was deployed in the right common femoral artery.  Plan: A limited arch aortogram was obtained with evaluation of the left carotid bifurcation given contralateral occlusion.  I see no evidence of more than 50% stenosis that would warrant surgery.  By NASCENT criteria I only measured about 20-30% stenosis in the left ICA.  The left carotid is extremely tortuous at the bifurcation and likely accounts for his elevated velocities in addition to contralateral occlusion.I will have him follow-up in 6 months with carotid duplex for ongoing surveillance and recommend medical management with aspirin, plavix, and statin.   Marty Heck, MD [facility] Office: [phone]

## 2020-04-02 NOTE — Progress Notes (Signed)
Client states Dr Carlis Abbott told him to hold metformin until Sunday; Dr Carlis Abbott notified client states can't take plavix and per Dr Carlis Abbott ok for client to cont aspirin and statin

## 2020-04-02 NOTE — Progress Notes (Signed)
Client up and walked and tolerated well; right groin stable no bleeding or hematoma

## 2020-04-02 NOTE — Progress Notes (Signed)
Dr Carlis Abbott in and ok to d/c home if b/p less than 811 systolic.

## 2020-04-02 NOTE — H&P (Signed)
History and Physical Interval Note:  04/02/2020 12:47 PM  Aaron Bonilla  has presented today for surgery, with the diagnosis of carotid stenosis.  The various methods of treatment have been discussed with the patient and family. After consideration of risks, benefits and other options for treatment, the patient has consented to  Procedure(s): CAROTID ANGIOGRAPHY (N/A) as a surgical intervention.  The patient's history has been reviewed, patient examined, no change in status, stable for surgery.  I have reviewed the patient's chart and labs.  Questions were answered to the patient's satisfaction.    Carotid angiogram - chronic right ica occlusion, evaluate left ICA.  Marty Heck  Patient name: RENE SIZELOVE MRN: [id number] DOB: 04-19-1946 Sex: male  REASON FOR CONSULT: Evaluate carotid disease  HPI:  Aaron Bonilla is a 74 y.o. male, history of coronary artery disease, diabetes, hypertension the presents for evaluation of carotid artery disease. He was seen in the ED in February at the direction of his opthalmologist with left-sided visual changes the describes as blurred vision. States at time feels likes there are worms in his visual field. Apparently he was told by his ophthalmologist to go to the ED. On evaluation in the ED had an ultrasound as well as a CT of his head. US showed age indeterminate right ICA occlusion and left ICA 50-69% stenosis extending to mid ICA. He reports no previous strokes or TIAs. He reports only some left-sided arm weakness when he had heart attack in 2004. No head or neck surgery. States at times his right eye is affected as well.      Past Medical History:  Diagnosis Date  . Coronary artery disease   . Diabetes mellitus without complication ([place])   . Gout   . Hypertension   . Renal disorder   . Stroke [facility])         Past Surgical History:  Procedure Laterality Date  . CAROTID STENT    . HEMORRHOID SURGERY          Family History  Problem  Relation Age of Onset  . Diabetes Mother   . Heart disease Mother   . Hypertension Mother   . Diabetes Father   . Heart disease Father   . Hypertension Father   . Cancer Sister    SOCIAL HISTORY:  Social History        Socioeconomic History  . Marital status: Married    Spouse name: Not on file  . Number of children: Not on file  . Years of education: Not on file  . Highest education level: Not on file  Occupational History  . Not on file  Tobacco Use  . Smoking status: Former Smoker    Types: Cigarettes, Cigars  . Smokeless tobacco: Never Used  Substance and Sexual Activity  . Alcohol use: No  . Drug use: No  . Sexual activity: Not on file  Other Topics Concern  . Not on file  Social History Narrative  . Not on file   Social Determinants of Health      Financial Resource Strain:   . Difficulty of Paying Living Expenses:   Food Insecurity:   . Worried About Charity fundraiser in the Last Year:   . Arboriculturist in the Last Year:   Transportation Needs:   . Film/video editor (Medical):   Aaron Bonilla Lack of Transportation (Non-Medical):   Physical Activity:   . Days of Exercise per Week:   .  Minutes of Exercise per Session:   Stress:   . Feeling of Stress :   Social Connections:   . Frequency of Communication with Friends and Family:   . Frequency of Social Gatherings with Friends and Family:   . Attends Religious Services:   . Active Member of Clubs or Organizations:   . Attends Archivist Meetings:   Aaron Bonilla Marital Status:   Intimate Partner Violence:   . Fear of Current or Ex-Partner:   . Emotionally Abused:   Aaron Bonilla Physically Abused:   . Sexually Abused:        Allergies  Allergen Reactions  . Hydrochlorothiazide Other (See Comments)  . Niacin Hives  . Niacin And Related Hives  . Oxycodone Nausea Only         Current Outpatient Medications  Medication Sig Dispense Refill  . amLODipine (NORVASC) 10 MG tablet Take 10 mg by mouth daily.     Aaron Bonilla  aspirin EC 81 MG tablet Take 81 mg by mouth daily.    Aaron Bonilla atorvastatin (LIPITOR) 80 MG tablet Take 80 mg by mouth at bedtime.    . Cholecalciferol (VITAMIN D) 50 MCG (2000 UT) CAPS Take 2,000 Units by mouth daily.    . cyanocobalamin 2000 MCG tablet Take 2,000 mcg by mouth every evening.    Aaron Bonilla glipiZIDE (GLUCOTROL XL) 10 MG 24 hr tablet Take 10 mg by mouth 2 (two) times daily.    . insulin glargine (LANTUS) 100 UNIT/ML injection Inject 60 Units into the skin 2 (two) times daily.    Aaron Bonilla lisinopril (ZESTRIL) 40 MG tablet Take 40 mg by mouth daily.    . metFORMIN (GLUCOPHAGE) 1000 MG tablet Take 1,000 mg by mouth 2 (two) times daily.     . metoprolol (TOPROL-XL) 200 MG 24 hr tablet Take 200 mg by mouth daily.    Aaron Bonilla omeprazole (PRILOSEC) 20 MG capsule Take 20 mg by mouth daily.    . pregabalin (LYRICA) 75 MG capsule Take 75 mg by mouth 2 (two) times daily.    . clopidogrel (PLAVIX) 75 MG tablet Take 1 tablet (75 mg total) by mouth daily. (Patient not taking: Reported on 03/25/2020) 30 tablet 2   No current facility-administered medications for this visit.   REVIEW OF SYSTEMS:  [X]  denotes positive finding, [ ]  denotes negative finding  Cardiac  Comments:  Chest pain or chest pressure:    Shortness of breath upon exertion:    Short of breath when lying flat:    Irregular heart rhythm:        Vascular    Pain in calf, thigh, or hip brought on by ambulation:    Pain in feet at night that wakes you up from your sleep:     Blood clot in your veins:    Leg swelling:         Pulmonary    Oxygen at home:    Productive cough:     Wheezing:         Neurologic    Sudden weakness in arms or legs:     Sudden numbness in arms or legs:     Sudden onset of difficulty speaking or slurred speech:    Temporary loss of vision in one eye:     Problems with dizziness:         Gastrointestinal    Blood in stool:     Vomited blood:         Genitourinary    Burning when urinating:  Blood in urine:         Psychiatric    Major depression:         Hematologic    Bleeding problems:    Problems with blood clotting too easily:        Skin    Rashes or ulcers:        Constitutional    Fever or chills:    PHYSICAL EXAM:      Vitals:   03/25/20 1413 03/25/20 1423  BP: (!) 172/98 (!) 167/91  Pulse: 77 77  Resp: 20   Temp: 97.9 F (36.6 C)   TempSrc: Temporal   Weight: 230 lb (104.3 kg)   Height: 5\' 11"  (1.803 m)    GENERAL: The patient is a well-nourished male, in no acute distress. The vital signs are documented above.  CARDIAC: There is a regular rate and rhythm.  VASCULAR:  Palpable radial femoral dorsalis pedis pulses bilaterally  PULMONARY: There is good air exchange bilaterally without wheezing or rales.  ABDOMEN: Soft and non-tender with normal pitched bowel sounds.  MUSCULOSKELETAL: There are no major deformities or cyanosis.  NEUROLOGIC: No focal weakness or paresthesias are detected. CN II-XII grossly intact.  SKIN: There are no ulcers or rashes noted.  PSYCHIATRIC: The patient has a normal affect.  DATA:  Ultrasound shows right ICA occlusion and left ICA moderate stenosis in 50-69% range extending to mid ICA.  CT head normal from 01/25/20.  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 He has been having vague blurry vision in the left eye (and right eye at times as well) and it does not sound like true amaurosis. I do not have any notes from his ophthalmologist. Discussed when ICA is occluded no role for revascularization, especially given chronic nature at this time. I would prefer to get a CTA neck but due to his renal function will have to get a carotid angiogram to see if he needs any surgical intervention. Carotid velocities can be over-estimated with contralateral occlusion and with duplex suggesting lesion extends to mid ICA want to ensure lesion is accessible.  Marty Heck, MD   [facility]  Office: [phone]

## 2020-12-24 ENCOUNTER — Encounter (HOSPITAL_COMMUNITY): Payer: Self-pay | Admitting: Emergency Medicine

## 2020-12-24 ENCOUNTER — Inpatient Hospital Stay (HOSPITAL_COMMUNITY): Payer: Medicare HMO

## 2020-12-24 ENCOUNTER — Inpatient Hospital Stay (HOSPITAL_COMMUNITY)
Admission: EM | Admit: 2020-12-24 | Discharge: 2020-12-26 | DRG: 064 | Disposition: A | Discharge status: Home Health | Payer: Medicare HMO | Attending: Internal Medicine | Admitting: Internal Medicine

## 2020-12-24 ENCOUNTER — Other Ambulatory Visit: Payer: Self-pay

## 2020-12-24 ENCOUNTER — Emergency Department (HOSPITAL_COMMUNITY): Payer: Medicare HMO

## 2020-12-24 DIAGNOSIS — Z809 Family history of malignant neoplasm, unspecified: Secondary | ICD-10-CM

## 2020-12-24 DIAGNOSIS — I2510 Atherosclerotic heart disease of native coronary artery without angina pectoris: Secondary | ICD-10-CM | POA: Diagnosis present

## 2020-12-24 DIAGNOSIS — E119 Type 2 diabetes mellitus without complications: Secondary | ICD-10-CM

## 2020-12-24 DIAGNOSIS — Z91041 Radiographic dye allergy status: Secondary | ICD-10-CM

## 2020-12-24 DIAGNOSIS — Z8673 Personal history of transient ischemic attack (TIA), and cerebral infarction without residual deficits: Secondary | ICD-10-CM | POA: Diagnosis not present

## 2020-12-24 DIAGNOSIS — N179 Acute kidney failure, unspecified: Secondary | ICD-10-CM | POA: Diagnosis present

## 2020-12-24 DIAGNOSIS — I6522 Occlusion and stenosis of left carotid artery: Secondary | ICD-10-CM

## 2020-12-24 DIAGNOSIS — Z833 Family history of diabetes mellitus: Secondary | ICD-10-CM

## 2020-12-24 DIAGNOSIS — M109 Gout, unspecified: Secondary | ICD-10-CM | POA: Diagnosis present

## 2020-12-24 DIAGNOSIS — E1122 Type 2 diabetes mellitus with diabetic chronic kidney disease: Secondary | ICD-10-CM | POA: Diagnosis present

## 2020-12-24 DIAGNOSIS — U071 COVID-19: Secondary | ICD-10-CM | POA: Diagnosis present

## 2020-12-24 DIAGNOSIS — I6381 Other cerebral infarction due to occlusion or stenosis of small artery: Principal | ICD-10-CM | POA: Diagnosis present

## 2020-12-24 DIAGNOSIS — Z7982 Long term (current) use of aspirin: Secondary | ICD-10-CM | POA: Diagnosis not present

## 2020-12-24 DIAGNOSIS — I639 Cerebral infarction, unspecified: Secondary | ICD-10-CM | POA: Diagnosis not present

## 2020-12-24 DIAGNOSIS — N189 Chronic kidney disease, unspecified: Secondary | ICD-10-CM | POA: Diagnosis not present

## 2020-12-24 DIAGNOSIS — I779 Disorder of arteries and arterioles, unspecified: Secondary | ICD-10-CM | POA: Diagnosis present

## 2020-12-24 DIAGNOSIS — E785 Hyperlipidemia, unspecified: Secondary | ICD-10-CM | POA: Diagnosis present

## 2020-12-24 DIAGNOSIS — N184 Chronic kidney disease, stage 4 (severe): Secondary | ICD-10-CM | POA: Diagnosis present

## 2020-12-24 DIAGNOSIS — I6389 Other cerebral infarction: Secondary | ICD-10-CM | POA: Diagnosis not present

## 2020-12-24 DIAGNOSIS — Z794 Long term (current) use of insulin: Secondary | ICD-10-CM

## 2020-12-24 DIAGNOSIS — Z87891 Personal history of nicotine dependence: Secondary | ICD-10-CM | POA: Diagnosis not present

## 2020-12-24 DIAGNOSIS — Z79899 Other long term (current) drug therapy: Secondary | ICD-10-CM

## 2020-12-24 DIAGNOSIS — I129 Hypertensive chronic kidney disease with stage 1 through stage 4 chronic kidney disease, or unspecified chronic kidney disease: Secondary | ICD-10-CM | POA: Diagnosis present

## 2020-12-24 DIAGNOSIS — Z885 Allergy status to narcotic agent status: Secondary | ICD-10-CM

## 2020-12-24 DIAGNOSIS — Z888 Allergy status to other drugs, medicaments and biological substances status: Secondary | ICD-10-CM | POA: Diagnosis not present

## 2020-12-24 DIAGNOSIS — G9341 Metabolic encephalopathy: Secondary | ICD-10-CM | POA: Diagnosis present

## 2020-12-24 DIAGNOSIS — I10 Essential (primary) hypertension: Secondary | ICD-10-CM | POA: Diagnosis present

## 2020-12-24 DIAGNOSIS — G8311 Monoplegia of lower limb affecting right dominant side: Secondary | ICD-10-CM | POA: Diagnosis present

## 2020-12-24 DIAGNOSIS — Z8249 Family history of ischemic heart disease and other diseases of the circulatory system: Secondary | ICD-10-CM | POA: Diagnosis not present

## 2020-12-24 DIAGNOSIS — R29701 NIHSS score 1: Secondary | ICD-10-CM | POA: Diagnosis present

## 2020-12-24 DIAGNOSIS — R531 Weakness: Secondary | ICD-10-CM | POA: Diagnosis present

## 2020-12-24 DIAGNOSIS — R54 Age-related physical debility: Secondary | ICD-10-CM | POA: Diagnosis present

## 2020-12-24 DIAGNOSIS — G9340 Encephalopathy, unspecified: Secondary | ICD-10-CM | POA: Diagnosis not present

## 2020-12-24 LAB — CBC WITH DIFFERENTIAL/PLATELET
Abs Immature Granulocytes: 0.02 10*3/uL (ref 0.00–0.07)
Basophils Absolute: 0 10*3/uL (ref 0.0–0.1)
Basophils Relative: 0 %
Eosinophils Absolute: 0.1 10*3/uL (ref 0.0–0.5)
Eosinophils Relative: 2 %
HCT: 39.1 % (ref 39.0–52.0)
Hemoglobin: 12.5 g/dL — ABNORMAL LOW (ref 13.0–17.0)
Immature Granulocytes: 0 %
Lymphocytes Relative: 18 %
Lymphs Abs: 1.2 10*3/uL (ref 0.7–4.0)
MCH: 26.9 pg (ref 26.0–34.0)
MCHC: 32 g/dL (ref 30.0–36.0)
MCV: 84.1 fL (ref 80.0–100.0)
Monocytes Absolute: 0.7 10*3/uL (ref 0.1–1.0)
Monocytes Relative: 10 %
Neutro Abs: 4.6 10*3/uL (ref 1.7–7.7)
Neutrophils Relative %: 70 %
Platelets: 203 10*3/uL (ref 150–400)
RBC: 4.65 MIL/uL (ref 4.22–5.81)
RDW: 16.5 % — ABNORMAL HIGH (ref 11.5–15.5)
WBC: 6.6 10*3/uL (ref 4.0–10.5)
nRBC: 0 % (ref 0.0–0.2)

## 2020-12-24 LAB — COMPREHENSIVE METABOLIC PANEL
ALT: 14 U/L (ref 0–44)
AST: 19 U/L (ref 15–41)
Albumin: 3.6 g/dL (ref 3.5–5.0)
Alkaline Phosphatase: 96 U/L (ref 38–126)
Anion gap: 11 (ref 5–15)
BUN: 43 mg/dL — ABNORMAL HIGH (ref 8–23)
CO2: 20 mmol/L — ABNORMAL LOW (ref 22–32)
Calcium: 10 mg/dL (ref 8.9–10.3)
Chloride: 108 mmol/L (ref 98–111)
Creatinine, Ser: 3.44 mg/dL — ABNORMAL HIGH (ref 0.61–1.24)
GFR, Estimated: 18 mL/min — ABNORMAL LOW (ref >60)
Glucose, Bld: 113 mg/dL — ABNORMAL HIGH (ref 70–99)
Potassium: 3.6 mmol/L (ref 3.5–5.1)
Sodium: 139 mmol/L (ref 135–145)
Total Bilirubin: 0.5 mg/dL (ref 0.3–1.2)
Total Protein: 7.3 g/dL (ref 6.5–8.1)

## 2020-12-24 LAB — POC SARS CORONAVIRUS 2 AG -  ED
SARS Coronavirus 2 Ag: POSITIVE — AB
SARS Coronavirus 2 Ag: POSITIVE — AB

## 2020-12-24 LAB — CBG MONITORING, ED
Glucose-Capillary: 52 mg/dL — ABNORMAL LOW (ref 70–99)
Glucose-Capillary: 199 mg/dL — ABNORMAL HIGH (ref 70–99)
Glucose-Capillary: 209 mg/dL — ABNORMAL HIGH (ref 70–99)

## 2020-12-24 LAB — HEMOGLOBIN A1C
Hgb A1c MFr Bld: 6.3 % — ABNORMAL HIGH (ref 4.8–5.6)
Mean Plasma Glucose: 134.11 mg/dL

## 2020-12-24 MED ORDER — SODIUM CHLORIDE 0.9 % IV SOLN
100.0000 mg | INTRAVENOUS | Status: AC
  Administered 2020-12-24 (×2): 100 mg via INTRAVENOUS
  Filled 2020-12-24: qty 20

## 2020-12-24 MED ORDER — STROKE: EARLY STAGES OF RECOVERY BOOK
Freq: Once | Status: DC
  Filled 2020-12-24: qty 1

## 2020-12-24 MED ORDER — PANTOPRAZOLE SODIUM 40 MG PO TBEC
40.0000 mg | DELAYED_RELEASE_TABLET | Freq: Every day | ORAL | Status: DC
  Administered 2020-12-24 – 2020-12-26 (×3): 40 mg via ORAL
  Filled 2020-12-24 (×3): qty 1

## 2020-12-24 MED ORDER — ENOXAPARIN SODIUM 30 MG/0.3ML ~~LOC~~ SOLN
30.0000 mg | SUBCUTANEOUS | Status: DC
  Administered 2020-12-24: 30 mg via SUBCUTANEOUS
  Filled 2020-12-24: qty 0.3

## 2020-12-24 MED ORDER — ASPIRIN EC 325 MG PO TBEC
325.0000 mg | DELAYED_RELEASE_TABLET | Freq: Every day | ORAL | Status: DC
  Administered 2020-12-24: 325 mg via ORAL
  Filled 2020-12-24: qty 1

## 2020-12-24 MED ORDER — ZINC SULFATE 220 (50 ZN) MG PO CAPS
220.0000 mg | ORAL_CAPSULE | Freq: Every day | ORAL | Status: DC
  Administered 2020-12-24 – 2020-12-26 (×3): 220 mg via ORAL
  Filled 2020-12-24 (×3): qty 1

## 2020-12-24 MED ORDER — SODIUM CHLORIDE 0.9 % IV SOLN
100.0000 mg | Freq: Every day | INTRAVENOUS | Status: AC
  Administered 2020-12-25 – 2020-12-26 (×2): 100 mg via INTRAVENOUS
  Filled 2020-12-24 (×3): qty 20

## 2020-12-24 MED ORDER — ATORVASTATIN CALCIUM 80 MG PO TABS
80.0000 mg | ORAL_TABLET | Freq: Every day | ORAL | Status: DC
  Administered 2020-12-24 – 2020-12-25 (×2): 80 mg via ORAL
  Filled 2020-12-24 (×2): qty 1

## 2020-12-24 MED ORDER — DEXTROSE 50 % IV SOLN
INTRAVENOUS | Status: AC
  Administered 2020-12-24: 50 mL
  Filled 2020-12-24: qty 50

## 2020-12-24 MED ORDER — GEMFIBROZIL 600 MG PO TABS
600.0000 mg | ORAL_TABLET | Freq: Every day | ORAL | Status: DC
  Administered 2020-12-25 – 2020-12-26 (×2): 600 mg via ORAL
  Filled 2020-12-24 (×2): qty 1

## 2020-12-24 MED ORDER — ACETAMINOPHEN 160 MG/5ML PO SOLN: 650.0000 mg | ORAL | Status: DC | PRN

## 2020-12-24 MED ORDER — GUAIFENESIN-DM 100-10 MG/5ML PO SYRP: 10.0000 mL | ORAL_SOLUTION | ORAL | Status: DC | PRN

## 2020-12-24 MED ORDER — HYDROCOD POLST-CPM POLST ER 10-8 MG/5ML PO SUER
5.0000 mL | Freq: Two times a day (BID) | ORAL | Status: DC | PRN
  Administered 2020-12-25 – 2020-12-26 (×2): 5 mL via ORAL
  Filled 2020-12-24 (×2): qty 5

## 2020-12-24 MED ORDER — METHYLPREDNISOLONE SODIUM SUCC 125 MG IJ SOLR
0.5000 mg/kg | Freq: Two times a day (BID) | INTRAMUSCULAR | Status: DC
Start: 2020-12-24 — End: 2020-12-25
  Administered 2020-12-24 – 2020-12-25 (×2): 45.625 mg via INTRAVENOUS
  Filled 2020-12-24 (×2): qty 2

## 2020-12-24 MED ORDER — INSULIN ASPART 100 UNIT/ML ~~LOC~~ SOLN
3.0000 [IU] | Freq: Three times a day (TID) | SUBCUTANEOUS | Status: DC
  Administered 2020-12-25 – 2020-12-26 (×4): 3 [IU] via SUBCUTANEOUS

## 2020-12-24 MED ORDER — SENNOSIDES-DOCUSATE SODIUM 8.6-50 MG PO TABS: 1.0000 | ORAL_TABLET | Freq: Every evening | ORAL | Status: DC | PRN

## 2020-12-24 MED ORDER — ASCORBIC ACID 500 MG PO TABS
500.0000 mg | ORAL_TABLET | Freq: Every day | ORAL | Status: DC
  Administered 2020-12-24 – 2020-12-26 (×3): 500 mg via ORAL
  Filled 2020-12-24 (×3): qty 1

## 2020-12-24 MED ORDER — SODIUM CHLORIDE 0.9 % IV SOLN: INTRAVENOUS | Status: DC

## 2020-12-24 MED ORDER — INSULIN ASPART 100 UNIT/ML ~~LOC~~ SOLN
0.0000 [IU] | Freq: Three times a day (TID) | SUBCUTANEOUS | Status: DC
  Administered 2020-12-25 (×3): 2 [IU] via SUBCUTANEOUS

## 2020-12-24 MED ORDER — PREDNISONE 5 MG PO TABS: 50.0000 mg | ORAL_TABLET | Freq: Every day | ORAL | Status: DC

## 2020-12-24 MED ORDER — ACETAMINOPHEN 650 MG RE SUPP: 650.0000 mg | RECTAL | Status: DC | PRN

## 2020-12-24 MED ORDER — ACETAMINOPHEN 325 MG PO TABS: 650.0000 mg | ORAL_TABLET | ORAL | Status: DC | PRN

## 2020-12-24 NOTE — ED Notes (Signed)
CRITICAL VALUE ALERT  Critical Value:  COVID positive  Date & Time Notied:  12/24/2020 @1340   Provider Notified: Dr Villa Herb, PA  Orders Received/Actions taken: sign up and wife informed by Alyse Low, PA.

## 2020-12-24 NOTE — ED Notes (Signed)
Given apple sauce, ginger-ale and peanut butter and crackers.  Tolerated well.

## 2020-12-24 NOTE — H&P (Signed)
History and Physical  [place] XTG:[id number] DOB: 05/05/1946 DOA: 12/24/2020  PCP: Frazier Richards, MD  Patient coming from: Home   I have personally briefly reviewed patient's old medical records in [place]  Chief Complaint: weakness   HPI: Aaron Bonilla is a 75 y.o. 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  He called primary care care provider they were concerned about stroke and he presented to [facility] ED as the ED in [place] was very busy.  His main complaint has been left leg weakness and inability to ambulate.  No vision changes.  Mild headache.  He has been mostly somnolent from the day.  He reports that he has been vaccinated for COVID-19.  ED Course: His SARS 2 coronavirus test was positive.  CT scan revealed acute lacunar infarct at the left thalamus.  Telemetry neurology was consulted and recommended inpatient admission for stroke work-up, MRI and MRA of the brain recommended, aspirin recommended and further recommendations to follow full inpatient neurology consultation.  Review of Systems: Review of Systems  Constitutional: Positive for malaise/fatigue and weight loss.  HENT: Negative.   Eyes: Negative.   Respiratory: Positive for cough, sputum production and shortness of breath.   Cardiovascular: Negative.   Gastrointestinal: Negative.   Genitourinary: Negative.   Musculoskeletal: Negative.   Skin: Negative.   Neurological: Positive for dizziness, sensory change, focal weakness, weakness and headaches. Negative for tingling, tremors, speech change, seizures and loss of consciousness.   Endo/Heme/Allergies: Negative.   Psychiatric/Behavioral: Positive for memory loss. Negative for depression, hallucinations, substance abuse and suicidal ideas. The patient is not nervous/anxious.   All other systems reviewed and are negative.  Past Medical History:  Diagnosis Date  . Coronary artery disease   . Diabetes mellitus without complication ([place])   . Gout   . Hypertension   . Renal disorder   . Stroke [facility])     Past Surgical History:  Procedure Laterality Date  . CAROTID ANGIOGRAPHY N/A 04/02/2020   Procedure: CAROTID ANGIOGRAPHY;  Surgeon: Marty Heck, MD;  Location: [facility];  Service: Cardiovascular;  Laterality: N/A;  . CAROTID STENT    . HEMORRHOID SURGERY       reports that he has quit smoking. His smoking use included cigarettes and cigars. He has never used smokeless tobacco. He reports that he does not drink alcohol and does not use drugs.  Allergies  Allergen Reactions  . Hydrochlorothiazide Other (See Comments)    unknown  . Niacin Hives  . Contrast Media [Iodinated Diagnostic Agents] Hives and Other (See Comments)    Shivering  . Clopidogrel Nausea And Vomiting  . Oxycodone Nausea Only    Family History  Problem Relation Age of Onset  . Diabetes Mother   . Heart disease Mother   . Hypertension Mother   . Diabetes Father   . Heart disease Father   . Hypertension Father   . Cancer Sister     Prior to Admission medications   Medication Sig Start Date End Date Taking? Authorizing Provider  amLODipine (NORVASC) 10 MG tablet Take 10 mg by  mouth daily.     [provider]  aspirin EC 81 MG tablet Take 81 mg by mouth daily.    [provider]  atorvastatin (LIPITOR) 80 MG tablet Take 80 mg by mouth at bedtime. 11/01/13   [provider]  cholecalciferol (VITAMIN D3) 25 MCG (1000 UNIT) tablet Take 1,000 Units by mouth daily.    [provider]  gemfibrozil (LOPID) 600 MG tablet Take 600 mg by mouth  daily.    [provider]  glipiZIDE (GLUCOTROL XL) 10 MG 24 hr tablet Take 10 mg by mouth daily.     [provider]  insulin detemir (LEVEMIR) 100 UNIT/ML injection Inject 60 Units into the skin 2 (two) times daily.    [provider]  lisinopril (ZESTRIL) 40 MG tablet Take 40 mg by mouth daily.    [provider]  metFORMIN (GLUCOPHAGE) 1000 MG tablet Take 1,000 mg by mouth 2 (two) times daily.     [provider]  metoprolol (TOPROL-XL) 200 MG 24 hr tablet Take 200 mg by mouth daily.    [provider]  mupirocin ointment (BACTROBAN) 2 % Place 1 application into the nose 2 (two) times daily as needed (skin cancer spots).    [provider]  omeprazole (PRILOSEC) 20 MG capsule Take 20 mg by mouth daily.    [provider]  pregabalin (LYRICA) 75 MG capsule Take 75 mg by mouth daily.     [provider]  vitamin B-12 (CYANOCOBALAMIN) 1000 MCG tablet Take 1,000 mcg by mouth daily.    [provider]    Physical Exam: Vitals:   12/24/20 1130 12/24/20 1200 12/24/20 1300 12/24/20 1433  BP: (!) 144/67 (!) 160/75 (!) 144/75 (!) 150/76  Pulse: 61 (!) 57 (!) 58 (!) 55  Resp: 16 13 19 10   Temp:      TempSrc:      SpO2: 95% 96% 98% 95%  Weight:      Height:       Constitutional: frail elderly male, somnolent but easily arousable, NAD, calm, comfortable.  Eyes: PERRL, lids and conjunctivae normal.  ENMT: Mucous membranes are moist. Posterior pharynx clear of any exudate or lesions.Normal dentition.  Neck: normal, supple, no masses, no thyromegaly Respiratory: clear to auscultation bilaterally, no wheezing, no crackles. Normal respiratory effort. No accessory muscle use.  Cardiovascular: Regular rate and rhythm, no murmurs / rubs / gallops. No extremity edema. 2+ pedal pulses. No carotid bruits.  Abdomen: no tenderness, no masses palpated. No hepatosplenomegaly. Bowel sounds positive.  Musculoskeletal: no  clubbing / cyanosis. No joint deformity upper and lower extremities. Good ROM, no contractures. Normal muscle tone.  Skin: no rashes, lesions, ulcers. No induration Neurologic: CN 2-12 grossly intact. Dense left hemiparesis.  Psychiatric: Normal judgment and insight. Alert and oriented x 3. Flat affect.    Labs on Admission: I have personally reviewed following labs and imaging studies  CBC: Recent Labs  Lab 12/24/20 1322  WBC 6.6  NEUTROABS 4.6  HGB 12.5*  HCT 39.1  MCV 84.1  PLT 562   Basic Metabolic Panel: Recent Labs  Lab 12/24/20 1322  NA 139  K 3.6  CL 108  CO2 20*  GLUCOSE 113*  BUN 43*  CREATININE 3.44*  CALCIUM 10.0   GFR: Estimated Creatinine Clearance: 21.1 mL/min (A) (by C-G formula based on SCr of 3.44 mg/dL (H)). Liver Function Tests: Recent Labs  Lab 12/24/20 1322  AST 19  ALT 14  ALKPHOS 96  BILITOT 0.5  PROT 7.3  ALBUMIN 3.6   No results for input(s): LIPASE, AMYLASE in the last 168 hours. No results for input(s): AMMONIA in the last 168 hours. Coagulation Profile: No results for input(s): INR, PROTIME in the last 168 hours. Cardiac Enzymes: No results for input(s): CKTOTAL, CKMB, CKMBINDEX, TROPONINI in the last 168 hours. BNP (last 3 results) No results for input(s): PROBNP in the last 8760 hours. HbA1C: No results for input(s): HGBA1C in the last 72 hours. CBG: No results for input(s): GLUCAP in the last 168 hours. Lipid Profile: No results for input(s): CHOL, HDL, LDLCALC, TRIG, CHOLHDL, LDLDIRECT in the last 72 hours. Thyroid Function Tests: No results for input(s): TSH, T4TOTAL, FREET4, T3FREE, THYROIDAB in the last 72 hours. Anemia Panel: No results for input(s): VITAMINB12, FOLATE, FERRITIN, TIBC, IRON, RETICCTPCT in the last 72 hours. Urine analysis: No results found for: COLORURINE, APPEARANCEUR, LABSPEC, PHURINE, GLUCOSEU, HGBUR, BILIRUBINUR, KETONESUR, PROTEINUR, UROBILINOGEN, NITRITE, LEUKOCYTESUR  Radiological Exams on  Admission: CT Head Wo Contrast  Result Date: 12/24/2020 CLINICAL DATA:  Neurological defect, suspected stroke over 61 hours old, fell last night at 2230 hours, RIGHT leg not working correctly, past history of stroke, hypertension, diabetes mellitus, former smoker EXAM: CT HEAD WITHOUT CONTRAST TECHNIQUE: Contiguous axial images were obtained from the base of the skull through the vertex without intravenous contrast. Sagittal and coronal MPR images reconstructed from axial data set. COMPARISON:  01/25/2020 FINDINGS: Brain: Minor scattered motion artifacts. Normal ventricular morphology. No midline shift or mass effect. Small vessel chronic ischemic changes of deep cerebral white matter. No intracranial hemorrhage or mass lesion. Area of white matter hypoattenuation at anterior aspect of LEFT thalamus consistent with lacunar infarct, new. No areas of cortical infarction identified. Vascular: No hyperdense vessels. Minimal atherosclerotic calcification of internal carotid arteries at skull base Skull: Intact Sinuses/Orbits: Clear Other: N/A IMPRESSION: Atrophy with small vessel chronic ischemic changes of deep cerebral white matter. New lacunar infarct at anterior aspect of LEFT thalamus. No other intracranial abnormalities. Electronically Signed   By: Lavonia Dana M.D.   On: 12/24/2020 12:37   EKG: Independently reviewed.  Assessment/Plan Principal Problem:   Acute CVA (cerebrovascular accident) [facility]) Active Problems:   Carotid artery disease ([place])   Diabetes mellitus without complication ([place])   Coronary artery disease   Generalized weakness   CKD (chronic kidney disease)   Gout   Hypertension   1. Acute left thalamic CVA - Obtain MRI / MRA brain as recommended by neurology.  Aspirin 325 mg daily for secondary prevention.  Telemonitoring ordered. Allow permissive hypertension.  Check 2D echo, carotid dopplers, check lipid panel, A1c, obtain PT/OT/SLP evaluation.   2. Hypertension - allowing  permissive hypertension in setting of acute CVA.  3. CKD - gently hydrate, recheck renal function in AM. Renally dose medications.  4. Type 2 Diabetes mellitus with vascular complications - SSI coverage ordered for now.  Follow and try to avoid hypoglycemia.  5. Gout - stable. Follow.  6. CAD - no chest pain symptoms, resume home cardioprotective medications.  7. Generalized weakness - Obtain PT/OT evaluation.   DVT prophylaxis: enoxaparin   Code Status: full   Family Communication: wife  Disposition Plan: admit to [facility] called: teleneurology   Admission status: INP   Khylan Sawyer MD [facility] How to contact the [facility] Attending or Consulting provider [place] or covering provider during after hours [place], for this patient?  1. Check the care team in [facility] and  look for a) attending/consulting [facility] provider listed and b) the [facility] team listed 2. Log into [email or web address] and use [place]'s universal password to access. If you do not have the password, please contact the hospital operator. 3. Locate the [facility] provider you are looking for under [facility] and page to a number that you can be directly reached. 4. If you still have difficulty reaching the provider, please page the [facility] (Director on Call) for the Hospitalists listed on amion for assistance.   If 7PM-7AM, please contact night-coverage [email or web address] Password [facility]  12/24/2020, 2:55 PM

## 2020-12-24 NOTE — ED Notes (Signed)
Dr Wynetta Emery aware of Glucose event.

## 2020-12-24 NOTE — ED Triage Notes (Addendum)
Fell about 2230 last night.  Family reports right leg not work correctly.  History of stroke in March.  Negative for NIHSS per EMS and family. History DM 188, HTN, kidney failure, and CABG. 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  Pt denies an pain at this time.  Wife reports LKW was at 2030 and wife seen pt fall at 2230.  Skin tear noted to right arm.  Denies hitting head.

## 2020-12-24 NOTE — ED Provider Notes (Signed)
[facility] EMERGENCY DEPARTMENT Provider Note   CSN: [id number] Arrival date & time: 12/24/20  1034     History Chief Complaint  Patient presents with  . Fall    Aaron Bonilla is a 75 y.o. male.  The history is provided by the patient. No language interpreter was used.  Weakness Severity:  Moderate Onset quality:  Gradual Timing:  Constant Progression:  Worsening Chronicity:  New Context: not recent infection   Relieved by:  Nothing Worsened by:  Nothing Ineffective treatments:  None tried Associated symptoms: falls   Risk factors: neurologic disease   Pt's wife reports pt was normal between 8:30 and 9:00 last pm.  Pt could not walk this am.  Wife reports left leg was weak.  She reports pt slid to floor.  Pt could not get himself up.  Pt had a stroke in October and was treated at [facility] in [place].      Past Medical History:  Diagnosis Date  . Coronary artery disease   . Diabetes mellitus without complication ([place])   . Gout   . Hypertension   . Renal disorder   . Stroke [facility])     Patient Active Problem List   Diagnosis Date Noted  . Carotid artery disease ([place]) 03/25/2020    Past Surgical History:  Procedure Laterality Date  . CAROTID ANGIOGRAPHY N/A 04/02/2020   Procedure: CAROTID ANGIOGRAPHY;  Surgeon: Marty Heck, MD;  Location: [place] [facility];  Service: Cardiovascular;  Laterality: N/A;  . CAROTID STENT    . HEMORRHOID SURGERY         Family History  Problem Relation Age of Onset  . Diabetes Mother   . Heart disease Mother   . Hypertension Mother   . Diabetes Father   . Heart disease Father   . Hypertension Father   . Cancer Sister     Social History   Tobacco Use  . Smoking status: Former Smoker    Types: Cigarettes, Cigars  . Smokeless tobacco: Never Used  Substance Use Topics  . Alcohol use: No  . Drug use: No    Home Medications Prior to Admission medications   Medication Sig Start Date End Date Taking? Authorizing  Provider  amLODipine (NORVASC) 10 MG tablet Take 10 mg by mouth daily.     [provider]  aspirin EC 81 MG tablet Take 81 mg by mouth daily.    [provider]  atorvastatin (LIPITOR) 80 MG tablet Take 80 mg by mouth at bedtime. 11/01/13   [provider]  cholecalciferol (VITAMIN D3) 25 MCG (1000 UNIT) tablet Take 1,000 Units by mouth daily.    [provider]  gemfibrozil (LOPID) 600 MG tablet Take 600 mg by mouth daily.    [provider]  glipiZIDE (GLUCOTROL XL) 10 MG 24 hr tablet Take 10 mg by mouth daily.     [provider]  insulin detemir (LEVEMIR) 100 UNIT/ML injection Inject 60 Units into the skin 2 (two) times daily.    [provider]  lisinopril (ZESTRIL) 40 MG tablet Take 40 mg by mouth daily.    [provider]  metFORMIN (GLUCOPHAGE) 1000 MG tablet Take 1,000 mg by mouth 2 (two) times daily.     [provider]  metoprolol (TOPROL-XL) 200 MG 24 hr tablet Take 200 mg by mouth daily.    [provider]  mupirocin ointment (BACTROBAN) 2 % Place 1 application into the nose 2 (two) times daily as  needed (skin cancer spots).    [provider]  omeprazole (PRILOSEC) 20 MG capsule Take 20 mg by mouth daily.    [provider]  pregabalin (LYRICA) 75 MG capsule Take 75 mg by mouth daily.     [provider]  vitamin B-12 (CYANOCOBALAMIN) 1000 MCG tablet Take 1,000 mcg by mouth daily.    [provider]    Allergies    Hydrochlorothiazide, Niacin, Contrast media [iodinated diagnostic agents], Clopidogrel, and Oxycodone  Review of Systems   Review of Systems  Musculoskeletal: Positive for falls.  Neurological: Positive for weakness.    Physical Exam Updated Vital Signs BP (!) 160/75   Pulse (!) 57   Temp 98.3 F (36.8 C) (Oral)   Resp 13   Ht _0  (1.803 m)   Wt 90.7 kg   SpO2 96%   BMI 27.89 kg/m   Physical Exam Vitals and nursing note  reviewed.  Constitutional:      Appearance: Normal appearance. He is well-developed and well-nourished.  HENT:     Head: Normocephalic and atraumatic.     Mouth/Throat:     Mouth: Mucous membranes are moist.  Eyes:     Conjunctiva/sclera: Conjunctivae normal.  Cardiovascular:     Rate and Rhythm: Normal rate and regular rhythm.     Heart sounds: No murmur heard.   Pulmonary:     Effort: Pulmonary effort is normal. No respiratory distress.     Breath sounds: Normal breath sounds.  Abdominal:     General: Abdomen is flat.     Palpations: Abdomen is soft.     Tenderness: There is no abdominal tenderness.  Musculoskeletal:        General: No edema.     Cervical back: Neck supple.  Skin:    General: Skin is warm and dry.  Neurological:     General: No focal deficit present.     Mental Status: He is alert.  Psychiatric:        Mood and Affect: Mood and affect normal.     Comments: Sleepy      ED Results / Procedures / Treatments   Labs (all labs ordered are listed, but only abnormal results are displayed) Labs Reviewed  CBC WITH DIFFERENTIAL/PLATELET - Abnormal; Notable for the following components:      Result Value   Hemoglobin 12.5 (*)    RDW 16.5 (*)    All other components within normal limits  COMPREHENSIVE METABOLIC PANEL  URINALYSIS, ROUTINE W REFLEX MICROSCOPIC  POC SARS CORONAVIRUS 2 AG -  ED    EKG None  Radiology CT Head Wo Contrast  Result Date: 12/24/2020 CLINICAL DATA:  Neurological defect, suspected stroke over 47 hours old, fell last night at 2230 hours, RIGHT leg not working correctly, past history of stroke, hypertension, diabetes mellitus, former smoker EXAM: CT HEAD WITHOUT CONTRAST TECHNIQUE: Contiguous axial images were obtained from the base of the skull through the vertex without intravenous contrast. Sagittal and coronal MPR images reconstructed from axial data set. COMPARISON:  01/25/2020 FINDINGS: Brain: Minor scattered motion artifacts.  Normal ventricular morphology. No midline shift or mass effect. Small vessel chronic ischemic changes of deep cerebral white matter. No intracranial hemorrhage or mass lesion. Area of white matter hypoattenuation at anterior aspect of LEFT thalamus consistent with lacunar infarct, new. No areas of cortical infarction identified. Vascular: No hyperdense vessels. Minimal atherosclerotic calcification of internal carotid arteries at skull base Skull: Intact Sinuses/Orbits: Clear Other: N/A IMPRESSION: Atrophy with  small vessel chronic ischemic changes of deep cerebral white matter. New lacunar infarct at anterior aspect of LEFT thalamus. No other intracranial abnormalities. Electronically Signed   By: Lavonia Dana M.D.   On: 12/24/2020 12:37    Procedures Procedures (including critical care time)  Medications Ordered in ED Medications - No data to display  ED Course  I have reviewed the triage vital signs and the nursing notes.  Pertinent labs & imaging results that were available during my care of the patient were reviewed by me and considered in my medical decision making (see chart for details).  Clinical Course as of 12/24/20 1405  Wed Dec 24, 2020  1338 POC SARS Coronavirus 2 Ag-ED - Nasal Swab (BD Veritor Kit) [LS]    Clinical Course User Index [LS] Fransico Meadow, PA-C   MDM Rules/Calculators/A&P                          POC covid is positive.  Pt has a new CVA.  I spoke with Neurology Dr. Theda Sers  who advised MRA and MRI as well as stoke workup. Medicine admission.  I spoke with Dr. Wynetta Emery who will admit  Final Clinical Impression(s) / ED Diagnoses Final diagnoses:  Cerebrovascular accident (CVA), unspecified mechanism ([place])  COVID    Rx / [place] Orders ED Discharge Orders    None       Sidney Ace 12/24/20 1506    Milton Ferguson, MD 12/25/20 1040

## 2020-12-24 NOTE — ED Notes (Signed)
[facility] cart at bedside.

## 2020-12-24 NOTE — ED Notes (Signed)
Meds to be reviewed, Dr Wynetta Emery aware, Wife says some meds are not correct.

## 2020-12-24 NOTE — ED Notes (Signed)
Skin ter to left arm, wife at bedside.  Wife says pt did not hit his head.

## 2020-12-24 NOTE — ED Notes (Addendum)
Verbal but hesitant consent by Aaron Bonilla [phone] for [facility] transfer.  States that at his baseline is independent, alert and oriented.   Currently able to independently move all 4 extremities, only alert to person.

## 2020-12-25 ENCOUNTER — Inpatient Hospital Stay (HOSPITAL_COMMUNITY): Payer: Medicare HMO

## 2020-12-25 DIAGNOSIS — I6389 Other cerebral infarction: Secondary | ICD-10-CM | POA: Diagnosis not present

## 2020-12-25 DIAGNOSIS — I639 Cerebral infarction, unspecified: Secondary | ICD-10-CM

## 2020-12-25 DIAGNOSIS — G9340 Encephalopathy, unspecified: Secondary | ICD-10-CM

## 2020-12-25 LAB — LIPID PANEL
Cholesterol: 187 mg/dL (ref 0–200)
Cholesterol: 187 mg/dL (ref 0–200)
HDL: 22 mg/dL — ABNORMAL LOW (ref >40)
HDL: 23 mg/dL — ABNORMAL LOW (ref >40)
LDL Cholesterol: 100 mg/dL — ABNORMAL HIGH (ref 0–99)
LDL Cholesterol: 98 mg/dL (ref 0–99)
Total CHOL/HDL Ratio: 8.5 RATIO
Total CHOL/HDL Ratio: 8.1 RATIO
Triglycerides: 325 mg/dL — ABNORMAL HIGH (ref <150)
Triglycerides: 328 mg/dL — ABNORMAL HIGH (ref <150)
VLDL: 65 mg/dL — ABNORMAL HIGH (ref 0–40)
VLDL: 66 mg/dL — ABNORMAL HIGH (ref 0–40)

## 2020-12-25 LAB — CBC WITH DIFFERENTIAL/PLATELET
Abs Immature Granulocytes: 0.01 10*3/uL (ref 0.00–0.07)
Basophils Absolute: 0 10*3/uL (ref 0.0–0.1)
Basophils Relative: 0 %
Eosinophils Absolute: 0 10*3/uL (ref 0.0–0.5)
Eosinophils Relative: 0 %
HCT: 35.3 % — ABNORMAL LOW (ref 39.0–52.0)
Hemoglobin: 11.9 g/dL — ABNORMAL LOW (ref 13.0–17.0)
Immature Granulocytes: 0 %
Lymphocytes Relative: 15 %
Lymphs Abs: 0.6 10*3/uL — ABNORMAL LOW (ref 0.7–4.0)
MCH: 27.1 pg (ref 26.0–34.0)
MCHC: 33.7 g/dL (ref 30.0–36.0)
MCV: 80.4 fL (ref 80.0–100.0)
Monocytes Absolute: 0.1 10*3/uL (ref 0.1–1.0)
Monocytes Relative: 4 %
Neutro Abs: 3.2 10*3/uL (ref 1.7–7.7)
Neutrophils Relative %: 81 %
Platelets: 143 10*3/uL — ABNORMAL LOW (ref 150–400)
RBC: 4.39 MIL/uL (ref 4.22–5.81)
RDW: 16.2 % — ABNORMAL HIGH (ref 11.5–15.5)
WBC: 4 10*3/uL (ref 4.0–10.5)
nRBC: 0 % (ref 0.0–0.2)

## 2020-12-25 LAB — ECHOCARDIOGRAM COMPLETE
Area-P 1/2: 2.17 cm2
Height: 71 in
S' Lateral: 3.5 cm
Weight: 3200 oz

## 2020-12-25 LAB — URINALYSIS, ROUTINE W REFLEX MICROSCOPIC
Bacteria, UA: NONE SEEN
Bilirubin Urine: NEGATIVE
Glucose, UA: >=500 mg/dL — AB
Hgb urine dipstick: NEGATIVE
Ketones, ur: NEGATIVE mg/dL
Leukocytes,Ua: NEGATIVE
Nitrite: NEGATIVE
Protein, ur: 100 mg/dL — AB
Specific Gravity, Urine: 1.017 (ref 1.005–1.030)
pH: 5 (ref 5.0–8.0)

## 2020-12-25 LAB — PHOSPHORUS: Phosphorus: 3.7 mg/dL (ref 2.5–4.6)

## 2020-12-25 LAB — COMPREHENSIVE METABOLIC PANEL
ALT: 15 U/L (ref 0–44)
AST: 23 U/L (ref 15–41)
Albumin: 3.3 g/dL — ABNORMAL LOW (ref 3.5–5.0)
Alkaline Phosphatase: 91 U/L (ref 38–126)
Anion gap: 15 (ref 5–15)
BUN: 41 mg/dL — ABNORMAL HIGH (ref 8–23)
CO2: 15 mmol/L — ABNORMAL LOW (ref 22–32)
Calcium: 9.6 mg/dL (ref 8.9–10.3)
Chloride: 109 mmol/L (ref 98–111)
Creatinine, Ser: 3.28 mg/dL — ABNORMAL HIGH (ref 0.61–1.24)
GFR, Estimated: 19 mL/min — ABNORMAL LOW (ref >60)
Glucose, Bld: 258 mg/dL — ABNORMAL HIGH (ref 70–99)
Potassium: 4.3 mmol/L (ref 3.5–5.1)
Sodium: 139 mmol/L (ref 135–145)
Total Bilirubin: 0.8 mg/dL (ref 0.3–1.2)
Total Protein: 6.6 g/dL (ref 6.5–8.1)

## 2020-12-25 LAB — GLUCOSE, CAPILLARY
Glucose-Capillary: 191 mg/dL — ABNORMAL HIGH (ref 70–99)
Glucose-Capillary: 154 mg/dL — ABNORMAL HIGH (ref 70–99)
Glucose-Capillary: 167 mg/dL — ABNORMAL HIGH (ref 70–99)
Glucose-Capillary: 190 mg/dL — ABNORMAL HIGH (ref 70–99)
Glucose-Capillary: 190 mg/dL — ABNORMAL HIGH (ref 70–99)

## 2020-12-25 LAB — D-DIMER, QUANTITATIVE: D-Dimer, Quant: 17.29 ug/mL-FEU — ABNORMAL HIGH (ref 0.00–0.50)

## 2020-12-25 LAB — FERRITIN: Ferritin: 248 ng/mL (ref 24–336)

## 2020-12-25 LAB — MAGNESIUM: Magnesium: 1.8 mg/dL (ref 1.7–2.4)

## 2020-12-25 LAB — C-REACTIVE PROTEIN: CRP: 2.2 mg/dL — ABNORMAL HIGH (ref <1.0)

## 2020-12-25 MED ORDER — SODIUM CHLORIDE 0.9 % IV SOLN: INTRAVENOUS | Status: AC

## 2020-12-25 MED ORDER — VITAMIN B-12 1000 MCG PO TABS
1000.0000 ug | ORAL_TABLET | Freq: Every day | ORAL | Status: DC
  Administered 2020-12-25 – 2020-12-26 (×2): 1000 ug via ORAL
  Filled 2020-12-25 (×3): qty 1

## 2020-12-25 MED ORDER — SODIUM BICARBONATE 650 MG PO TABS
650.0000 mg | ORAL_TABLET | Freq: Every day | ORAL | Status: DC
  Administered 2020-12-25 – 2020-12-26 (×2): 650 mg via ORAL
  Filled 2020-12-25 (×2): qty 1

## 2020-12-25 MED ORDER — INSULIN DETEMIR 100 UNIT/ML ~~LOC~~ SOLN
20.0000 [IU] | Freq: Two times a day (BID) | SUBCUTANEOUS | Status: DC
  Administered 2020-12-25 (×2): 20 [IU] via SUBCUTANEOUS
  Filled 2020-12-25 (×4): qty 0.2

## 2020-12-25 MED ORDER — CLOPIDOGREL BISULFATE 75 MG PO TABS
75.0000 mg | ORAL_TABLET | Freq: Every day | ORAL | Status: DC
  Administered 2020-12-25 – 2020-12-26 (×2): 75 mg via ORAL
  Filled 2020-12-25 (×2): qty 1

## 2020-12-25 MED ORDER — EMPAGLIFLOZIN 10 MG PO TABS
10.0000 mg | ORAL_TABLET | Freq: Every day | ORAL | Status: DC
  Administered 2020-12-26: 10 mg via ORAL
  Filled 2020-12-25 (×2): qty 1

## 2020-12-25 MED ORDER — PREDNISONE 20 MG PO TABS: 40.0000 mg | ORAL_TABLET | Freq: Every day | ORAL | Status: DC

## 2020-12-25 MED ORDER — ASPIRIN 81 MG PO CHEW
81.0000 mg | CHEWABLE_TABLET | Freq: Every day | ORAL | Status: DC
  Administered 2020-12-25 – 2020-12-26 (×2): 81 mg via ORAL
  Filled 2020-12-25 (×2): qty 1

## 2020-12-25 MED ORDER — ENOXAPARIN SODIUM 30 MG/0.3ML ~~LOC~~ SOLN: 30.0000 mg | SUBCUTANEOUS | Status: DC

## 2020-12-25 MED ORDER — ENOXAPARIN SODIUM 60 MG/0.6ML ~~LOC~~ SOLN
0.5000 mg/kg | SUBCUTANEOUS | Status: DC
  Administered 2020-12-25: 45 mg via SUBCUTANEOUS
  Filled 2020-12-25: qty 0.6

## 2020-12-25 NOTE — Progress Notes (Signed)
PROGRESS NOTE                                                                                                                                                                                                             Patient Demographics:    Aaron Bonilla, is a 75 y.o. male, DOB - 1946-06-18, IHW:[id number]  Outpatient Primary MD for the patient is Adamo, Hattie Perch, MD    LOS - 1  Admit date - 12/24/2020    Chief Complaint  Patient presents with  . Fall       Brief Narrative (HPI from H&P)  Aaron Bonilla is a 75 y.o. 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   Subjective:    Gerrald Basu today has, No headache, No chest pain, No abdominal pain - No Nausea, mild cough and R leg weakness.   Assessment  & Plan :     1. Acute posterior right periatrial white matter CVA - per wife stroke recently and was already on maximal treatment, continue dual antiplatelet therapy along with Lopid and statin.  Stroke team to follow, defer work-up and treatment to them.  Currently seems to be complaining of mild right lower extremity weakness ? and is somewhat confused.  2.  Incidental COVID-19 infection.  Mild cough, no shortness of breath, 3 days of remdesivir, monitor inflammatory markers.  SpO2: 100 %  Recent Labs  Lab 12/24/20 1322 12/25/20 0135  WBC 6.6 4.0  HGB 12.5* 11.9*  HCT 39.1 35.3*  PLT 203 143*   AST 19  --   ALT 14  --   ALKPHOS 96  --   BILITOT 0.5  --   ALBUMIN 3.6  --     3.  Dyslipidemia.  On high-dose statin and Lopid.  4.  Essential hypertension.  For now  permissive hypertension.  5.  Metabolic encephalopathy.  Likely worse due to stroke, supportive care and monitor.    6.  AKI on CKD 4.  Baseline creatinine close to 3.  Gently hydrate and monitor.  Hold nephrotoxins.    7. DM Type II.  Lantus and sliding scale.  Lab Results  Component Value Date   HGBA1C 6.3 (H) 12/24/2020   Lipid Panel     Component Value Date/Time   CHOL 187 12/25/2020 0719   TRIG 328 (H) 12/25/2020 0719   HDL 23 (L) 12/25/2020 0719   CHOLHDL 8.1 12/25/2020 0719   VLDL 66 (H) 12/25/2020 0719   LDLCALC 98 12/25/2020 0719    CBG (last 3)  Recent Labs    12/24/20 2116 12/25/20 0444 12/25/20 0804  GLUCAP 209* 191* 154*           Condition - Extremely Guarded  Family Communication  : Wife [place] [phone] on 12/25/2020 message left at 9:10 AM  Code Status :  Full  Consults  :  Neuro  Procedures  :   TTE  MRI-A -  MRI HEAD IMPRESSION:  1. 1 cm acute ischemic nonhemorrhagic infarct involving the posterior right periatrial white matter. 2. Underlying age-related cerebral atrophy with mild to moderate chronic microvascular ischemic disease, with a few scattered remote lacunar infarcts involving the right basal ganglia, left thalamus, and midbrain.  MRA HEAD IMPRESSION:  1. Technically limited exam due to extensive motion artifact. 2. 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 Right MCA perfused via collateral flow across the circle-of-Willis. 3. Otherwise gross patency of the major intracranial arterial circulation. No visible proximal high-grade or correctable stenosis.   PUD Prophylaxis : None  Disposition Plan  :    Status is: Inpatient  Remains inpatient appropriate because:IV treatments appropriate due to intensity of illness or  inability to take PO   Dispo: The patient is from: Home              Anticipated d/c is to: Home              Anticipated d/c date is: 3 days              Patient currently is not medically stable to d/c.    DVT Prophylaxis  :  Lovenox added  Lab Results  Component Value Date   PLT 143 (L) 12/25/2020    Diet :  Diet Order            Diet Carb Modified Fluid consistency: Thin; Room service appropriate? Yes  Diet effective now                  Inpatient Medications  Scheduled Meds: .  stroke: mapping our early stages of recovery book   Does not apply Once  . vitamin C  500 mg Oral Daily  . aspirin  81 mg Oral Daily  . atorvastatin  80 mg Oral QHS  . clopidogrel  75 mg Oral Daily  . empagliflozin  10 mg Oral Daily  . gemfibrozil  600 mg Oral Daily  . insulin aspart  0-9 Units Subcutaneous TID WC  . insulin aspart  3 Units Subcutaneous TID WC  . insulin detemir  20 Units Subcutaneous BID  . pantoprazole  40 mg Oral Daily  . sodium bicarbonate  650 mg Oral Daily  . vitamin B-12  1,000 mcg Oral Daily  . zinc sulfate  220 mg Oral Daily   Continuous Infusions: .  sodium chloride    . remdesivir 100 mg in NS 100 mL     PRN Meds:.acetaminophen **OR** [DISCONTINUED] acetaminophen (TYLENOL) oral liquid 160 mg/5 mL **OR** [DISCONTINUED] acetaminophen, chlorpheniramine-HYDROcodone, guaiFENesin-dextromethorphan, senna-docusate  Antibiotics  :    Anti-infectives (From admission, onward)   Start     Dose/Rate Route Frequency Ordered Stop   12/25/20 1000  remdesivir 100 mg in sodium chloride 0.9 % 100 mL IVPB        100 mg 200 mL/hr over 30 Minutes Intravenous Daily 12/24/20 1508 12/29/20 0959   12/24/20 1530  remdesivir 100 mg in sodium chloride 0.9 % 100 mL IVPB        100 mg 200 mL/hr over 30 Minutes Intravenous Every 1 hr x 2 12/24/20 1508 12/24/20 1630       Time Spent in minutes  30   Lala Lund M.D on 12/25/2020 at 9:20 AM  To page go to [email or web address]    [facility] -  Office  [phone]   See all Orders from today for further details    Objective:   Vitals:   12/24/20 2242 12/25/20 0022 12/25/20 0524 12/25/20 0805  BP: (!) 170/84 (!) 156/91 (!) 156/78 (!) 147/80  Pulse: 61 61 (!) 55 (!) 59  Resp: 15 12 12 16   Temp: 98.1 F (36.7 C) 98.1 F (36.7 C) 98.2 F (36.8 C) 98.3 F (36.8 C)  TempSrc: Axillary Axillary Axillary Axillary  SpO2: 100% 99% 96% 100%  Weight:      Height:        Wt Readings from Last 3 Encounters:  12/24/20 90.7 kg  04/02/20 105.2 kg  03/25/20 104.3 kg     Intake/Output Summary (Last 24 hours) at 12/25/2020 0920 Last data filed at 12/24/2020 2302 Gross per 24 hour  Intake 811.21 ml  Output 500 ml  Net 311.21 ml     Physical Exam  Awake mildly confused, some subjective weakness in the right lower extremity but states that is chronic,  [place].AT,PERRAL Supple Neck,No JVD, No cervical lymphadenopathy appriciated.  Symmetrical Chest wall movement, Good air movement bilaterally, CTAB RRR,No Gallops,Rubs or new Murmurs, No Parasternal Heave +ve B.Sounds, Abd Soft, No tenderness, No organomegaly appriciated, No rebound - guarding or rigidity. No Cyanosis, Clubbing or edema, No new Rash or bruise       Data Review:    CBC Recent Labs  Lab 12/24/20 1322 12/25/20 0135  WBC 6.6 4.0  HGB 12.5* 11.9*  HCT 39.1 35.3*  PLT 203 143*  MCV 84.1 80.4  MCH 26.9 27.1  MCHC 32.0 33.7  RDW 16.5* 16.2*  LYMPHSABS 1.2 0.6*  MONOABS 0.7 0.1  EOSABS 0.1 0.0  BASOSABS 0.0 0.0    Recent Labs  Lab 12/24/20 1322  NA 139  K 3.6  CL 108  CO2 20*  GLUCOSE 113*  BUN 43*  CREATININE 3.44*  CALCIUM 10.0  AST 19  ALT 14  ALKPHOS 96  BILITOT 0.5  ALBUMIN 3.6  HGBA1C 6.3*    ------------------------------------------------------------------------------------------------------------------ Recent Labs    12/25/20 0719  CHOL 187  HDL 23*  LDLCALC 98  TRIG 328*  CHOLHDL 8.1    Lab  Results  Component Value Date   HGBA1C 6.3 (H) 12/24/2020   ------------------------------------------------------------------------------------------------------------------ No results for input(s): TSH, T4TOTAL, T3FREE, THYROIDAB in the last 72 hours.  Invalid input(s): FREET3  Cardiac Enzymes No results for input(s): CKMB, TROPONINI, MYOGLOBIN in the last 168 hours.  Invalid input(s): CK ------------------------------------------------------------------------------------------------------------------ No results found for: BNP  Micro Results No results found for this or any previous visit (from the past 240 hour(s)).  Radiology Reports CT Head Wo Contrast  Result Date: 12/24/2020 CLINICAL DATA:  Neurological defect, suspected stroke over 58 hours old, fell last night at 2230 hours, RIGHT leg not working correctly, past history of stroke, hypertension, diabetes mellitus, former smoker EXAM: CT HEAD WITHOUT CONTRAST TECHNIQUE: Contiguous axial images were obtained from the base of the skull through the vertex without intravenous contrast. Sagittal and coronal MPR images reconstructed from axial data set. COMPARISON:  01/25/2020 FINDINGS: Brain: Minor scattered motion artifacts. Normal ventricular morphology. No midline shift or mass effect. Small vessel chronic ischemic changes of deep cerebral white matter. No intracranial hemorrhage or mass lesion. Area of white matter hypoattenuation at anterior aspect of LEFT thalamus consistent with lacunar infarct, new. No areas of cortical infarction identified. Vascular: No hyperdense vessels. Minimal atherosclerotic calcification of internal carotid arteries at skull base Skull: Intact Sinuses/Orbits: Clear Other: N/A IMPRESSION: Atrophy with small vessel chronic ischemic changes of deep cerebral white matter. New lacunar infarct at anterior aspect of LEFT thalamus. No other intracranial abnormalities. Electronically Signed   By: Lavonia Dana M.D.   On:  12/24/2020 12:37   MR ANGIO HEAD WO CONTRAST  Result Date: 12/24/2020 CLINICAL DATA:  Initial evaluation for acute stroke, left lower extremity weakness. History of prior strokes. EXAM: MRI HEAD WITHOUT CONTRAST MRA HEAD WITHOUT CONTRAST TECHNIQUE: Multiplanar, multiecho pulse sequences of the brain and surrounding structures were obtained without intravenous contrast. Angiographic images of the head were obtained using MRA technique without contrast. COMPARISON:  Prior CT from earlier the same day. FINDINGS: MRI HEAD FINDINGS Brain: Examination degraded by motion artifact. Generalized age-related cerebral atrophy. Patchy and confluent T2/FLAIR hyperintensity within the periventricular deep white matter both cerebral hemispheres, most consistent with chronic small vessel ischemic disease, mild to moderate in nature. Few scattered superimposed remote lacunar infarcts present about the right basal ganglia, left thalamus, and midbrain. 1 cm linear focus of restricted diffusion seen involving the posterior right periatrial white matter, consistent with an acute ischemic infarct (series 9, image 22). No associated hemorrhage or mass effect. No other diffusion abnormality to suggest acute or subacute ischemia. Gray-white matter differentiation otherwise maintained. No acute intracranial hemorrhage. Subcentimeter focus of susceptibility artifact within the adjacent right periatrial white matter consistent with a small chronic microhemorrhage, likely small vessel related. No other evidence for acute or chronic intracranial hemorrhage. No mass lesion, midline shift or mass effect. No hydrocephalus or extra-axial fluid collection. Pituitary gland suprasellar region within normal limits. Midline structures intact. Vascular: Absent flow void within the right ICA to the level of the terminus, consistent with occlusion. Major intracranial vascular flow voids are otherwise maintained. Skull and upper cervical spine:  Craniocervical junction within normal limits. Bone marrow signal intensity normal. Small lipoma noted at the right frontal scalp. Scalp soft tissues otherwise unremarkable. Sinuses/Orbits: Globes and orbital soft tissues demonstrate no acute finding. Mild mucosal thickening noted within the ethmoidal air cells. Paranasal sinuses are otherwise clear. No significant mastoid effusion. Other: None. MRA HEAD FINDINGS ANTERIOR CIRCULATION: Examination moderately to severely degraded by motion artifact. Absent flow void within the right ICA to the level of the terminus, consistent with occlusion. This was evident on a prior carotid ultrasound from 01/25/2020, consistent with a chronic finding. Left ICA widely patent to the terminus without stenosis. A1 segments patent bilaterally. Grossly normal anterior communicating artery complex. Anterior cerebral arteries perfused to their distal aspects without appreciable  stenosis. Left M1 segment widely patent. Normal left MCA bifurcation. Distal left MCA branches well perfused. Attenuated but patent flow within the right M1 segment, collateral nature across the circle-of-Willis and/or from the posterior circulation via a small right posterior communicating artery. Grossly normal right MCA bifurcation. Right MCA branches well perfused, although demonstrate attenuated flow related to the chronic right ICA occlusion. POSTERIOR CIRCULATION: Both V4 segments patent to the vertebrobasilar junction without stenosis. Left vertebral dominant. Both PICA origins patent and normal. Basilar patent to its distal aspect without stenosis. Superior cerebellar arteries patent bilaterally. Both PCAs primarily supplied via the basilar. Multifocal irregularity throughout the P2 segments could be related to motion and/or atherosclerotic disease. PCAs remain well perfused to their distal aspects. No visible aneurysm or other vascular abnormality on this motion degraded exam. IMPRESSION: MRI HEAD  IMPRESSION: 1. 1 cm acute ischemic nonhemorrhagic infarct involving the posterior right periatrial white matter. 2. Underlying age-related cerebral atrophy with mild to moderate chronic microvascular ischemic disease, with a few scattered remote lacunar infarcts involving the right basal ganglia, left thalamus, and midbrain. MRA HEAD IMPRESSION: 1. Technically limited exam due to extensive motion artifact. 2. 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 Right MCA perfused via collateral flow across the circle-of-Willis. 3. Otherwise gross patency of the major intracranial arterial circulation. No visible proximal high-grade or correctable stenosis. Electronically Signed   By: Jeannine Boga M.D.   On: 12/24/2020 19:03   MR BRAIN WO CONTRAST  Result Date: 12/24/2020 CLINICAL DATA:  Initial evaluation for acute stroke, left lower extremity weakness. History of prior strokes. EXAM: MRI HEAD WITHOUT CONTRAST MRA HEAD WITHOUT CONTRAST TECHNIQUE: Multiplanar, multiecho pulse sequences of the brain and surrounding structures were obtained without intravenous contrast. Angiographic images of the head were obtained using MRA technique without contrast. COMPARISON:  Prior CT from earlier the same day. FINDINGS: MRI HEAD FINDINGS Brain: Examination degraded by motion artifact. Generalized age-related cerebral atrophy. Patchy and confluent T2/FLAIR hyperintensity within the periventricular deep white matter both cerebral hemispheres, most consistent with chronic small vessel ischemic disease, mild to moderate in nature. Few scattered superimposed remote lacunar infarcts present about the right basal ganglia, left thalamus, and midbrain. 1 cm linear focus of restricted diffusion seen involving the posterior right periatrial white matter, consistent with an acute ischemic infarct (series 9, image 22). No associated hemorrhage or mass effect. No other diffusion abnormality to suggest acute or subacute  ischemia. Gray-white matter differentiation otherwise maintained. No acute intracranial hemorrhage. Subcentimeter focus of susceptibility artifact within the adjacent right periatrial white matter consistent with a small chronic microhemorrhage, likely small vessel related. No other evidence for acute or chronic intracranial hemorrhage. No mass lesion, midline shift or mass effect. No hydrocephalus or extra-axial fluid collection. Pituitary gland suprasellar region within normal limits. Midline structures intact. Vascular: Absent flow void within the right ICA to the level of the terminus, consistent with occlusion. Major intracranial vascular flow voids are otherwise maintained. Skull and upper cervical spine: Craniocervical junction within normal limits. Bone marrow signal intensity normal. Small lipoma noted at the right frontal scalp. Scalp soft tissues otherwise unremarkable. Sinuses/Orbits: Globes and orbital soft tissues demonstrate no acute finding. Mild mucosal thickening noted within the ethmoidal air cells. Paranasal sinuses are otherwise clear. No significant mastoid effusion. Other: None. MRA HEAD FINDINGS ANTERIOR CIRCULATION: Examination moderately to severely degraded by motion artifact. Absent flow void within the right ICA to the level of the terminus, consistent with occlusion. This was evident on  a prior carotid ultrasound from 01/25/2020, consistent with a chronic finding. Left ICA widely patent to the terminus without stenosis. A1 segments patent bilaterally. Grossly normal anterior communicating artery complex. Anterior cerebral arteries perfused to their distal aspects without appreciable stenosis. Left M1 segment widely patent. Normal left MCA bifurcation. Distal left MCA branches well perfused. Attenuated but patent flow within the right M1 segment, collateral nature across the circle-of-Willis and/or from the posterior circulation via a small right posterior communicating artery. Grossly  normal right MCA bifurcation. Right MCA branches well perfused, although demonstrate attenuated flow related to the chronic right ICA occlusion. POSTERIOR CIRCULATION: Both V4 segments patent to the vertebrobasilar junction without stenosis. Left vertebral dominant. Both PICA origins patent and normal. Basilar patent to its distal aspect without stenosis. Superior cerebellar arteries patent bilaterally. Both PCAs primarily supplied via the basilar. Multifocal irregularity throughout the P2 segments could be related to motion and/or atherosclerotic disease. PCAs remain well perfused to their distal aspects. No visible aneurysm or other vascular abnormality on this motion degraded exam. IMPRESSION: MRI HEAD IMPRESSION: 1. 1 cm acute ischemic nonhemorrhagic infarct involving the posterior right periatrial white matter. 2. Underlying age-related cerebral atrophy with mild to moderate chronic microvascular ischemic disease, with a few scattered remote lacunar infarcts involving the right basal ganglia, left thalamus, and midbrain. MRA HEAD IMPRESSION: 1. Technically limited exam due to extensive motion artifact. 2. 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 Right MCA perfused via collateral flow across the circle-of-Willis. 3. Otherwise gross patency of the major intracranial arterial circulation. No visible proximal high-grade or correctable stenosis. Electronically Signed   By: Jeannine Boga M.D.   On: 12/24/2020 19:03   DG CHEST PORT 1 VIEW  Result Date: 12/24/2020 CLINICAL DATA:  Coronavirus infection. EXAM: PORTABLE CHEST 1 VIEW COMPARISON:  None. FINDINGS: Heart size is normal. Mediastinal shadows are normal. The lungs are clear except for mild atelectasis or scarring at the medial lung bases. No consolidation or lobar collapse. No effusion. No significant bone finding. IMPRESSION: Mild atelectasis or scarring at the medial lung bases. Electronically Signed   By: Nelson Chimes M.D.   On:  12/24/2020 15:13

## 2020-12-25 NOTE — Evaluation (Signed)
Physical Therapy Evaluation Patient Details Name: Aaron Bonilla MRN: [id number] DOB: 02-18-1946 Today's Date: 12/25/2020   History of Present Illness  75 y.o. 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  Clinical Impression   Pt presents with LE weakness R>L, impaired dynamic standing balance, difficulty peforming mobility tasks vs baseline, altered mental status vs baseline, and decreased activity tolerance. Pt to benefit from acute PT to address deficits. Pt ambulated hallway distance with intermittent SL support, pt overall requiring close guard to min assist during mobility tasks. Pt reports independence at baseline, but pt is a questionable historian today. PT recommending HHPT to address deficits post-acutely.  PT to progress mobility as tolerated, and will continue to follow acutely.      Follow Up Recommendations Home health PT;Supervision/Assistance - 24 hour    Equipment Recommendations  None recommended by PT (may benefit from RW, will re-assess next session)    Recommendations for Other Services       Precautions / Restrictions Precautions Precautions: Fall Precaution Comments: mild RLE weakness Restrictions Weight Bearing Restrictions: No      Mobility  Bed Mobility Overal bed mobility: Needs Assistance Bed Mobility: Supine to Sit     Supine to sit: Supervision     General bed mobility comments: for safety, increased time and effort to come to EOB with posterior leaning noted during scooting.    Transfers Overall transfer level: Needs assistance Equipment used: 1 person hand held assist Transfers: Sit to/from Stand Sit to Stand: Min assist         General transfer comment: min assist to rise and steady, pt reaching for IV pole to steady self  once standing.  Ambulation/Gait Ambulation/Gait assistance: Min guard Gait Distance (Feet): 110 Feet Assistive device: None;IV Pole Gait Pattern/deviations: Step-through pattern;Decreased stride length;Narrow base of support Gait velocity: decr   General Gait Details: min guard for safety, pt intermittently reaching for IV pole to steady self during gait. Pt with min RLE buckling, pt-corrected but more pronounced with fatigue. DOE 2/4, unable to obtain SpO2 pleth with multiple attempts but pt states he gets winded with mobility at baseline.  Stairs            Wheelchair Mobility    Modified Rankin (Stroke Patients Only) Modified Rankin (Stroke Patients Only) Pre-Morbid Rankin Score: No symptoms Modified Rankin: Moderately severe disability     Balance Overall balance assessment: Needs assistance Sitting-balance support: No upper extremity supported;Feet supported Sitting balance-Leahy Scale: Good Sitting balance - Comments: able to lean anteriorly and right balance to don socks, more difficulty donning R sock   Standing balance support: No upper extremity supported;During functional activity Standing balance-Leahy Scale: Fair Standing balance comment: able to ambulate without UE support               High Level Balance Comments: SLS bilaterally x5 seconds, with LUE support             Pertinent Vitals/Pain Pain Assessment: No/denies pain    Home Living Family/patient expects to be discharged to:: Private residence Living Arrangements: Spouse/significant other;Children Available Help at Discharge: Family Type of Home: House Home Access: Stairs to enter Entrance Stairs-Rails: None Entrance Stairs-Number of Steps: 5 Home Layout: Able to live on main level with bedroom/bathroom Home Equipment: None      Prior Function  Level of Independence: Independent         Comments: Pt is a inconsistent historian, unsure of accuracy of information. Pt reports he  enjoys walking outside     Hand Dominance   Dominant Hand: Left    Extremity/Trunk Assessment   Upper Extremity Assessment Upper Extremity Assessment: Defer to OT evaluation    Lower Extremity Assessment Lower Extremity Assessment: RLE deficits/detail;LLE deficits/detail RLE Deficits / Details: MMT: hip flexion 2/5, knee extension 2+/5, knee flexion 3+/5, hip abd/add 3+/5 RLE Sensation: decreased light touch (of plantar surface of foot) LLE Deficits / Details: MMT: hip flexion 3+/5, knee extension 5/5, knee flexion 5/5, hip abd/add 3+/5    Cervical / Trunk Assessment Cervical / Trunk Assessment: Normal  Communication   Communication: No difficulties  Cognition Arousal/Alertness: Awake/alert Behavior During Therapy: WFL for tasks assessed/performed;Impulsive Overall Cognitive Status: Impaired/Different from baseline Area of Impairment: Orientation;Following commands;Safety/judgement;Awareness;Problem solving                 Orientation Level: Disoriented to;Person;Situation     Following Commands: Follows one step commands consistently Safety/Judgement: Decreased awareness of safety Awareness: Intellectual Problem Solving: Requires verbal cues;Requires tactile cues General Comments: Pt oriented to name and location, unable to state he had a CVA but does state "I am patient number 1" suspect referring to covid 19 infection. Initially pt states he does not live with his wife and lives with his daughter, changes story midway through and states "yes I live with my wife". Pt requires cues for safe mobility throughout, attempts to move quickly and requires cues to wait for PT.      General Comments      Exercises     Assessment/Plan    PT Assessment Patient needs continued PT services  PT Problem List Decreased strength;Decreased mobility;Decreased safety awareness;Decreased activity tolerance;Decreased balance;Impaired sensation;Decreased cognition       PT  Treatment Interventions DME instruction;Therapeutic activities;Gait training;Therapeutic exercise;Patient/family education;Balance training;Stair training;Functional mobility training;Neuromuscular re-education    PT Goals (Current goals can be found in the Care Plan section)  Acute Rehab PT Goals Patient Stated Goal: none stated PT Goal Formulation: With patient Time For Goal Achievement: 01/08/21 Potential to Achieve Goals: Good    Frequency Min 4X/week   Barriers to discharge        Co-evaluation               AM-PAC PT "6 Clicks" Mobility  Outcome Measure Help needed turning from your back to your side while in a flat bed without using bedrails?: A Little Help needed moving from lying on your back to sitting on the side of a flat bed without using bedrails?: A Little Help needed moving to and from a bed to a chair (including a wheelchair)?: A Little Help needed standing up from a chair using your arms (e.g., wheelchair or bedside chair)?: A Little Help needed to walk in hospital room?: A Little Help needed climbing 3-5 steps with a railing? : A Lot 6 Click Score: 17    End of Session Equipment Utilized During Treatment: Gait belt Activity Tolerance: Patient limited by fatigue;Patient tolerated treatment well Patient left: in chair;with call bell/phone within reach;with chair alarm set;with nursing/sitter in room Nurse Communication: Mobility status PT Visit Diagnosis: Other abnormalities of gait and mobility (R26.89);Difficulty in walking, not elsewhere classified (R26.2)    Time: 1610-9604 PT Time Calculation (min) (ACUTE ONLY): 23 min   Charges:   PT Evaluation $PT Eval Low Complexity: 1 Low PT Treatments $  Gait Training: 8-22 mins        Stacie Glaze, PT Acute Rehabilitation Services Pager [phone]  Office [phone]   Louis Matte 12/25/2020, 10:01 AM

## 2020-12-25 NOTE — Plan of Care (Signed)
  Problem: Education: Goal: Knowledge of risk factors and measures for prevention of condition will improve Outcome: Progressing   Problem: Coping: Goal: Psychosocial and spiritual needs will be supported Outcome: Progressing   

## 2020-12-25 NOTE — Consult Note (Signed)
NEUROLOGY CONSULTATION NOTE   Date of service: December 25, 2020 Patient Name: Aaron Bonilla MRN:  [id number] DOB:  07-Feb-1946 Reason for consult: "Stroke R periatrial stroke" _ _ _   _ __   _ __ _ _  __ __   _ __   __ _  History of Present Illness  Aaron Bonilla is a 75 y.o. male with PMH significant for diabetes, hypertension, coronary artery disease, gout who woke up with right leg weakness.  Last known well 2030 on 12/23/2020.  MRI brain demonstrates a 1 cm acute ischemic infarct in the posterior right periatrial white matter.  MRI also demonstrates remote right basal ganglia, left thalamus and midbrain lacunar infarcts . He is admitted with encephalopathy and R leg weakness and found to be Covid positive.  Work-up so far with vitals with no significant abnormality, labs with elevated creatinine consistent with his history of CKD, LDL of 98, no significant abnormality on the CBC or chemistry.  MR angio head without contrast redemonstrates chronic right ICA occlusion at the level of the terminus.  Right MCA is perfused via collateral flow across the circle of Willis.  No other significant arterial abnormality.  TTE and carotid duplex are pending.  On discussion with his wife, she reports that he was wak on his R side after his stroke in October but got better in November and able to walk with a limp of with holding onto someone or something. He tried to get up on Tuesday night and fell and appeared weaker in his R leg.  LKW: 2030 on 12/23/2020. MRS: 3 TPA and thrombectomy: Outside the window for both.  NIHSS components Score: Comment  1a Level of Conscious 0[]  1[x]  2[]  3[]      1b LOC Questions 0[]  1[x]  2[]       1c LOC Commands 0[x]  1[]  2[]       2 Best Gaze 0[x]  1[]  2[]       3 Visual 0[x]  1[]  2[]  3[]      4 Facial Palsy 0[x]  1[]  2[]  3[]      5a Motor Arm - left 0[x]  1[]  2[]  3[]  4[]  UN[]    5b Motor Arm - Right 0[x]  1[]  2[]  3[]  4[]  UN[]    6a Motor Leg - Left 0[x]  1[]  2[]  3[]  4[]  UN[]    6b  Motor Leg - Right 0[]  1[x]  2[]  3[]  4[]  UN[]    7 Limb Ataxia 0[x]  1[]  2[]  3[]  UN[]     8 Sensory 0[x]  1[]  2[]  UN[]      9 Best Language 0[x]  1[]  2[]  3[]      10 Dysarthria 0[x]  1[]  2[]  UN[]      11 Extinct. and Inattention 0[x]  1[]  2[]       TOTAL: 2     ROS   Unable to obtain a detailed review of system due to slow thought process, poor attention and encephalopathy.  Past History   Past Medical History:  Diagnosis Date  . Coronary artery disease   . Diabetes mellitus without complication ([place])   . Gout   . Hypertension   . Renal disorder   . Stroke [facility])    Past Surgical History:  Procedure Laterality Date  . CAROTID ANGIOGRAPHY N/A 04/02/2020   Procedure: CAROTID ANGIOGRAPHY;  Surgeon: Marty Heck, MD;  Location: [facility];  Service: Cardiovascular;  Laterality: N/A;  . CAROTID STENT    . HEMORRHOID SURGERY     Family History  Problem Relation Age of Onset  . Diabetes Mother   . Heart  disease Mother   . Hypertension Mother   . Diabetes Father   . Heart disease Father   . Hypertension Father   . Cancer Sister    Social History   Socioeconomic History  . Marital status: Married    Spouse name: Not on file  . Number of children: Not on file  . Years of education: Not on file  . Highest education level: Not on file  Occupational History  . Not on file  Tobacco Use  . Smoking status: Former Smoker    Types: Cigarettes, Cigars  . Smokeless tobacco: Never Used  Substance and Sexual Activity  . Alcohol use: No  . Drug use: No  . Sexual activity: Not on file  Other Topics Concern  . Not on file  Social History Narrative  . Not on file   Social Determinants of Health   Financial Resource Strain: Not on file  Food Insecurity: Not on file  Transportation Needs: Not on file  Physical Activity: Not on file  Stress: Not on file  Social Connections: Not on file   Allergies  Allergen Reactions  . Hydrochlorothiazide Other (See Comments)     unknown  . Niacin Hives  . Contrast Media [Iodinated Diagnostic Agents] Hives and Other (See Comments)    Shivering  . Clopidogrel Nausea And Vomiting  . Oxycodone Nausea Only    Medications   Medications Prior to Admission  Medication Sig Dispense Refill Last Dose  . amLODipine (NORVASC) 10 MG tablet Take 10 mg by mouth daily.      Marland Kitchen aspirin EC 81 MG tablet Take 81 mg by mouth daily.     Marland Kitchen atorvastatin (LIPITOR) 80 MG tablet Take 80 mg by mouth at bedtime.     . cloNIDine (CATAPRES - DOSED IN MG/24 HR) 0.1 mg/24hr patch Place 0.1 mg onto the skin once a week.     . clopidogrel (PLAVIX) 75 MG tablet Take 75 mg by mouth daily.     Marland Kitchen gemfibrozil (LOPID) 600 MG tablet Take 600 mg by mouth 2 (two) times daily.     Marland Kitchen glipiZIDE (GLUCOTROL XL) 10 MG 24 hr tablet Take 10 mg by mouth daily.      . hydrALAZINE (APRESOLINE) 100 MG tablet Take 100 mg by mouth 3 (three) times daily.     Marland Kitchen lisinopril (ZESTRIL) 40 MG tablet Take 40 mg by mouth daily.     . metoprolol (TOPROL-XL) 200 MG 24 hr tablet Take 200 mg by mouth daily.     Marland Kitchen omeprazole (PRILOSEC) 20 MG capsule Take 20 mg by mouth daily.     . pregabalin (LYRICA) 75 MG capsule Take 25 mg by mouth every evening.     Marland Kitchen allopurinol (ZYLOPRIM) 100 MG tablet Take 100 mg by mouth daily.     . cholecalciferol (VITAMIN D3) 25 MCG (1000 UNIT) tablet Take 1,000 Units by mouth daily.     . insulin detemir (LEVEMIR) 100 UNIT/ML injection Inject 20 Units into the skin 2 (two) times daily.     Marland Kitchen JARDIANCE 10 MG TABS tablet Take 10 mg by mouth daily.     . metFORMIN (GLUCOPHAGE) 1000 MG tablet Take 1,000 mg by mouth 2 (two) times daily.      . mupirocin ointment (BACTROBAN) 2 % Place 1 application into the nose 3 (three) times daily.     . sodium bicarbonate 650 MG tablet Take 650 mg by mouth daily.     . vitamin B-12 (CYANOCOBALAMIN) 1000  MCG tablet Take 1,000 mcg by mouth daily.        Vitals   Vitals:   12/24/20 2242 12/25/20 0022 12/25/20 0524  12/25/20 0805  BP: (!) 170/84 (!) 156/91 (!) 156/78 (!) 147/80  Pulse: 61 61 (!) 55 (!) 59  Resp: 15 12 12 16   Temp: 98.1 F (36.7 C) 98.1 F (36.7 C) 98.2 F (36.8 C) 98.3 F (36.8 C)  TempSrc: Axillary Axillary Axillary Axillary  SpO2: 100% 99% 96% 100%  Weight:      Height:         Body mass index is 27.89 kg/m.  Physical Exam   General: Laying comfortably in bed; in no acute distress. HENT: Normal oropharynx and mucosa. Normal external appearance of ears and nose.  Neck: Supple, no pain or tenderness  CV: No JVD. No peripheral edema.  Pulmonary: Symmetric Chest rise. Normal respiratory effort.  Abdomen: Soft to touch, non-tender.  Ext: No cyanosis, edema, or deformity  Skin: No rash. Normal palpation of skin.   Musculoskeletal: Normal digits and nails by inspection. No clubbing.   Neurologic Examination  Mental status/Cognition: Encephalopathic, oriented to self, place, but not to month and year.  Poor attention.  Requires a lot of encouragement to participate with exam and to answer any questions. Speech/language: Mildly dysarthric speech, fluent, comprehension intact, object naming intact. Cranial nerves:   CN II Pupils equal and reactive to light, no VF deficits    CN III,IV,VI EOM intact, no gaze preference or deviation, no nystagmus    CN V normal sensation in V1, V2, and V3 segments bilaterally    CN VII no asymmetry, no nasolabial fold flattening    CN VIII normal hearing to speech    CN IX & X normal palatal elevation, no uvular deviation    CN XI 5/5 head turn and 5/5 shoulder shrug bilaterally    CN XII midline tongue protrusion    Motor:  Muscle bulk: normal, tone normal, pronator drift none tremor none Mvmt Root Nerve  Muscle Right Left Comments  SA C5/6 Ax Deltoid     EF C5/6 Mc Biceps 5 5   EE C6/7/8 Rad Triceps 5 5   WF C6/7 Med FCR     WE C7/8 PIN ECU 5 5   F Ab C8/T1 U ADM/FDI     HF L1/2/3 Fem Illopsoas 4+ 5   KE L2/3/4 Fem Quad 5 5   DF  L4/5 D Peron Tib Ant 5 5   PF S1/2 Tibial Grc/Sol 5 5    Reflexes:  Right Left Comments  Pectoralis      Biceps (C5/6) 1 1   Brachioradialis (C5/6) 1 1    Triceps (C6/7) 1 1    Patellar (L3/4) 1 1    Achilles (S1)      Hoffman      Plantar     Jaw jerk    Sensation:  Light touch Intact throughout   Pin prick    Temperature    Vibration   Proprioception    Coordination/Complex Motor:  - Finger to Nose intact BL - Heel to shin unable to do but no obvious ataxia. - Rapid alternating movement are slowed. - Gait: unable to assess.  Labs   CBC:  Recent Labs  Lab 12/24/20 1322 12/25/20 0135  WBC 6.6 4.0  NEUTROABS 4.6 3.2  HGB 12.5* 11.9*  HCT 39.1 35.3*  MCV 84.1 80.4  PLT 203 143*    Basic Metabolic Panel:  Lab Results  Component Value Date   NA 139 12/24/2020   K 3.6 12/24/2020   CO2 20 (L) 12/24/2020   GLUCOSE 113 (H) 12/24/2020   BUN 43 (H) 12/24/2020   CREATININE 3.44 (H) 12/24/2020   CALCIUM 10.0 12/24/2020   GFRNONAA 18 (L) 12/24/2020   GFRAA 27 (L) 01/25/2020   Lipid Panel:  Lab Results  Component Value Date   LDLCALC 98 12/25/2020   HgbA1c:  Lab Results  Component Value Date   HGBA1C 6.3 (H) 12/24/2020   Urine Drug Screen: No results found for: LABOPIA, COCAINSCRNUR, LABBENZ, AMPHETMU, THCU, LABBARB  Alcohol Level No results found for: [place]  CT Head without contrast: CTH was negative for a large hypodensity concerning for a large territory infarct or hyperdensity concerning for an ICH  New lacunar infarct in the anterior aspect left thalamus.  MR angio Head: Redemonstration of known chronic right ICA occlusion at the level of the terminus.  Right MCA is perfused via collateral flow across the circle of Willis.  MRI Brain without contrast: Ischemic infarct at the posterior right periatrial white matter.   Impression   DAUD CAYER is a 75 y.o. male admitted with encephalopathy and right leg weakness.  Discussion with wife seems  like he has baseline right-sided weakness and had worsening of his weakness over the last 2 days and unable to walk.  MRI brain without contrast demonstrates a 1 cm acute ischemic infarct in the right posterior periatrial white matter.  This stroke is unlikely to explain his right leg weakness.  A more possible explanation is worsening of his prior stroke symptoms potentially from his old left thalamic infarct in the setting of encephalopathy.  Will complete stroke work-up for the noted right periatrial white matter infarct but appears to be most consistent with a small vessel disease infarct. Recommendations  - Frequent Neuro checks per stroke unit protocol - Vasc duplex carotid BL pending. - TTE pending. - Continue Atorvastatin 80mg  daily. - Recommend HbA1c - Antithrombotic -Aspirin 81mg  daily and plavix 75mg  daily x 3 weeks, then plavix 75mg  daily. - DVT prophylaxis. - SBP goal - permissive hypertension first 24 h < 220/110. Hold home meds.  - Recommend Telemetry monitoring for arrythmia - Recommend bedside swallow screen prior to PO intake. - Stroke education booklet - Recommend PT/OT/SLP consult  ______________________________________________________________________   Thank you for the opportunity to take part in the care of this patient. If you have any further questions, please contact the neurology consultation attending.  Signed,  [facility] Pager Number [phone] _ _ _   _ __   _ __ _ _  __ __   _ __   __ _

## 2020-12-25 NOTE — Progress Notes (Signed)
ANTICOAGULATION CONSULT NOTE - Initial Consult  Pharmacy Consult for Lovenox  Indication: VTE prophylaxis  Allergies  Allergen Reactions  . Hydrochlorothiazide Other (See Comments)    unknown  . Niacin Hives  . Contrast Media [Iodinated Diagnostic Agents] Hives and Other (See Comments)    Shivering  . Clopidogrel Nausea And Vomiting  . Oxycodone Nausea Only    Patient Measurements: Height: 5\' 11"  (180.3 cm) Weight: 90.7 kg (200 lb) IBW/kg (Calculated) : 75.3   Vital Signs: Temp: 98.3 F (36.8 C) (01/06 0805) Temp Source: Axillary (01/06 0805) BP: 147/80 (01/06 0805) Pulse Rate: 59 (01/06 0805)  Labs: Recent Labs    12/24/20 1322 12/25/20 0135  HGB 12.5* 11.9*  HCT 39.1 35.3*  PLT 203 143*  CREATININE 3.44* 3.28*    Estimated Creatinine Clearance: 22.1 mL/min (A) (by C-G formula based on SCr of 3.28 mg/dL (H)).   Medical History: Past Medical History:  Diagnosis Date  . Coronary artery disease   . Diabetes mellitus without complication ([place])   . Gout   . Hypertension   . Renal disorder   . Stroke [facility])      Assessment:  75 y.o male admitted 1/5 with lacunar L thalamic CVA, covid +.  CKD: CrC 22 ml/min.   , BMI 27.8 D-dimer 17.29 , no CP, no SOB, on RA Discussed with Dr. Candiss Norse, will increase VTE prophylaxis dosing to  moderate dosing  0.5 mg/kg mg sq q24h = 45 mg SQ q24hr.   Goal of Therapy:  VTE prevention Monitor platelets by anticoagulation protocol: Yes   Plan:  Enoxaparin 45 mg SQ every 24 hours  (0.5 mg/kg q24hr) Monitor renal function, CBC , for signs/symptoms of bleeding  Nicole Cella, RPh Clinical Pharmacist  [phone] Please check AMION for all [place] phone numbers After 10:00 PM, call [place] [phone] 12/25/2020,12:38 PM

## 2020-12-25 NOTE — Evaluation (Signed)
Occupational Therapy Evaluation Patient Details Name: COLEN ELTZROTH MRN: [id number] DOB: 1946-04-06 Today's Date: 12/25/2020    History of Present Illness 75 y.o. 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   Clinical Impression   Pt admitted with above. He demonstrates the below listed deficits and will benefit from continued OT to maximize safety and independence with BADLs.  Pt presents to OT with mild balance deficits, as well as cognitive deficits.  He requires independent - max A for ADLs and min guard assist for ADLs.  Per his wife's description this may be close to his baseline.  He lives with his wife, who is his primary caregiver, and has good support from family. She is eager for his return home, and will be able to provide 24 hour supervision.  Recommend HHOT at discharge.       Follow Up Recommendations  Home health OT;Supervision/Assistance - 24 hour    Equipment Recommendations  None recommended by OT    Recommendations for Other Services       Precautions / Restrictions Precautions Precautions: Fall Precaution Comments: mild RLE weakness      Mobility Bed Mobility Overal bed mobility: Needs Assistance Bed Mobility: Supine to Sit     Supine to sit: Supervision          Transfers Overall transfer level: Needs assistance Equipment used: None Transfers: Sit to/from Stand;Stand Pivot Transfers Sit to Stand: Min guard Stand pivot transfers: Min guard       General transfer comment: min gaurd for safety    Balance Overall balance assessment: Needs assistance Sitting-balance support: No upper extremity supported;Feet supported Sitting balance-Leahy Scale: Good     Standing balance support: No upper extremity supported;During functional  activity Standing balance-Leahy Scale: Fair Standing balance comment: able to maintain static standing with min guard assist                           ADL either performed or assessed with clinical judgement   ADL Overall ADL's : Needs assistance/impaired Eating/Feeding: Independent;Sitting Eating/Feeding Details (indicate cue type and reason): pt able to cut up food and self feed Grooming: Wash/dry hands;Wash/dry face;Oral care;Min guard;Standing;Sitting   Upper Body Bathing: Minimal assistance;Sitting   Lower Body Bathing: Moderate assistance;Sit to/from stand   Upper Body Dressing : Minimal assistance;Sitting   Lower Body Dressing: Moderate assistance;Sit to/from stand   Toilet Transfer: Min guard;Ambulation;Comfort height toilet   Toileting- Clothing Manipulation and Hygiene: Min guard;Sit to/from stand       Functional mobility during ADLs: Min guard       Vision         Perception     Praxis      Pertinent Vitals/Pain Pain Assessment: No/denies pain     Hand Dominance Left   Extremity/Trunk Assessment Upper Extremity Assessment Upper Extremity Assessment: Generalized weakness   Lower Extremity Assessment Lower Extremity Assessment: Defer to PT evaluation   Cervical / Trunk Assessment Cervical / Trunk Assessment: Normal   Communication Communication Communication: No difficulties   Cognition Arousal/Alertness: Awake/alert Behavior During Therapy: WFL for tasks assessed/performed;Impulsive Overall Cognitive Status: No family/caregiver present to determine baseline cognitive functioning Area of Impairment: Attention;Orientation;Memory;Following commands;Safety/judgement;Awareness;Problem solving  Orientation Level: Disoriented to;Time;Situation Current Attention Level: Sustained Memory: Decreased short-term memory Following Commands: Follows one step commands consistently Safety/Judgement: Decreased awareness of  deficits;Decreased awareness of safety Awareness: Intellectual Problem Solving: Requires verbal cues;Requires tactile cues General Comments: Pt not oriented to time and situation.  He was very pleasant.  He was unable to provide accurate info re: PLOF.  Wife reports he has had cognitive deficits since prior CVA so this may be his baseline   General Comments  VSS    Exercises     Shoulder Instructions      Home Living Family/patient expects to be discharged to:: Private residence Living Arrangements: Spouse/significant other Available Help at Discharge: Family;Available 24 hours/day Type of Home: House Home Access: Stairs to enter [employer] of Steps: 3 in front and 3 in the back Entrance Stairs-Rails: None Home Layout: One level     Bathroom Shower/Tub: Tub/shower unit;Curtain   Biochemist, clinical: Handicapped height     Home Equipment: Wheelchair - [employer] - 4 wheels;Walker - standard;Cane - single point;Bedside commode;Shower seat   Additional Comments: wife able      Prior Functioning/Environment Level of Independence: Needs assistance  Gait / Transfers Assistance Needed: ambulating mod I with no AD, but wifestayed close by. ADL's / Homemaking Assistance Needed: requires assist from wife for bathing.  required assist for dressing.  goes to barber for shaving. toiletin independently   Comments: Pt is unreliable historian.  Phoned wife who provided accurate home set up info and info re: PLOF        OT Problem List: Decreased strength;Decreased activity tolerance;Impaired balance (sitting and/or standing);Decreased safety awareness;Decreased knowledge of use of DME or AE;Decreased cognition      OT Treatment/Interventions: Self-care/ADL training;DME and/or AE instruction;Therapeutic activities;Cognitive remediation/compensation;Patient/family education;Balance training    OT Goals(Current goals can be found in the care plan section) Acute Rehab OT  Goals Patient Stated Goal: for pt to go home OT Goal Formulation: With patient Potential to Achieve Goals: Good ADL Goals Pt Will Perform Grooming: with min guard assist;standing Pt Will Transfer to Toilet: with min guard assist;ambulating;regular height toilet;bedside commode;grab bars Pt Will Perform Toileting - Clothing Manipulation and hygiene: with min guard assist;sit to/from stand  OT Frequency: Min 2X/week   Barriers to D/C:            Co-evaluation              AM-PAC OT "6 Clicks" Daily Activity     Outcome Measure Help from another person eating meals?: None Help from another person taking care of personal grooming?: A Little Help from another person toileting, which includes using toliet, bedpan, or urinal?: A Little Help from another person bathing (including washing, rinsing, drying)?: A Little Help from another person to put on and taking off regular upper body clothing?: A Little Help from another person to put on and taking off regular lower body clothing?: A Little 6 Click Score: 19   End of Session Nurse Communication: Mobility status  Activity Tolerance: Patient tolerated treatment well Patient left: in chair;with call bell/phone within reach;with chair alarm set  OT Visit Diagnosis: Unsteadiness on feet (R26.81);Cognitive communication deficit (R41.841)                Time: 7062-3762 OT Time Calculation (min): 17 min Charges:  OT General Charges $OT Visit: 1 Visit OT Evaluation $OT Eval Moderate Complexity: 1 Mod  Nilsa Nutting., OTR/L Acute Rehabilitation Services Pager [phone] Office [place], [place] 12/25/2020,  5:37 PM

## 2020-12-25 NOTE — Progress Notes (Signed)
  Echocardiogram 2D Echocardiogram has been performed.  Aaron Bonilla 12/25/2020, 3:11 PM

## 2020-12-26 ENCOUNTER — Other Ambulatory Visit (HOSPITAL_COMMUNITY): Payer: Self-pay | Admitting: Internal Medicine

## 2020-12-26 LAB — C-REACTIVE PROTEIN: CRP: 1.8 mg/dL — ABNORMAL HIGH (ref <1.0)

## 2020-12-26 LAB — CBC WITH DIFFERENTIAL/PLATELET
Abs Immature Granulocytes: 0.02 10*3/uL (ref 0.00–0.07)
Basophils Absolute: 0 10*3/uL (ref 0.0–0.1)
Basophils Relative: 0 %
Eosinophils Absolute: 0 10*3/uL (ref 0.0–0.5)
Eosinophils Relative: 0 %
HCT: 34.3 % — ABNORMAL LOW (ref 39.0–52.0)
Hemoglobin: 11.4 g/dL — ABNORMAL LOW (ref 13.0–17.0)
Immature Granulocytes: 0 %
Lymphocytes Relative: 26 %
Lymphs Abs: 1.5 10*3/uL (ref 0.7–4.0)
MCH: 26.5 pg (ref 26.0–34.0)
MCHC: 33.2 g/dL (ref 30.0–36.0)
MCV: 79.8 fL — ABNORMAL LOW (ref 80.0–100.0)
Monocytes Absolute: 0.5 10*3/uL (ref 0.1–1.0)
Monocytes Relative: 8 %
Neutro Abs: 3.6 10*3/uL (ref 1.7–7.7)
Neutrophils Relative %: 66 %
Platelets: 171 10*3/uL (ref 150–400)
RBC: 4.3 MIL/uL (ref 4.22–5.81)
RDW: 16 % — ABNORMAL HIGH (ref 11.5–15.5)
WBC: 5.5 10*3/uL (ref 4.0–10.5)
nRBC: 0 % (ref 0.0–0.2)

## 2020-12-26 LAB — GLUCOSE, CAPILLARY
Glucose-Capillary: 70 mg/dL (ref 70–99)
Glucose-Capillary: 139 mg/dL — ABNORMAL HIGH (ref 70–99)

## 2020-12-26 LAB — COMPREHENSIVE METABOLIC PANEL
ALT: 13 U/L (ref 0–44)
AST: 15 U/L (ref 15–41)
Albumin: 3.1 g/dL — ABNORMAL LOW (ref 3.5–5.0)
Alkaline Phosphatase: 80 U/L (ref 38–126)
Anion gap: 15 (ref 5–15)
BUN: 48 mg/dL — ABNORMAL HIGH (ref 8–23)
CO2: 17 mmol/L — ABNORMAL LOW (ref 22–32)
Calcium: 9.4 mg/dL (ref 8.9–10.3)
Chloride: 107 mmol/L (ref 98–111)
Creatinine, Ser: 3.27 mg/dL — ABNORMAL HIGH (ref 0.61–1.24)
GFR, Estimated: 19 mL/min — ABNORMAL LOW (ref >60)
Glucose, Bld: 130 mg/dL — ABNORMAL HIGH (ref 70–99)
Potassium: 3.3 mmol/L — ABNORMAL LOW (ref 3.5–5.1)
Sodium: 139 mmol/L (ref 135–145)
Total Bilirubin: 0.6 mg/dL (ref 0.3–1.2)
Total Protein: 6.3 g/dL — ABNORMAL LOW (ref 6.5–8.1)

## 2020-12-26 LAB — D-DIMER, QUANTITATIVE: D-Dimer, Quant: 0.48 ug/mL-FEU (ref 0.00–0.50)

## 2020-12-26 LAB — BRAIN NATRIURETIC PEPTIDE: B Natriuretic Peptide: 92.2 pg/mL (ref 0.0–100.0)

## 2020-12-26 LAB — MAGNESIUM: Magnesium: 1.7 mg/dL (ref 1.7–2.4)

## 2020-12-26 MED ORDER — AMLODIPINE BESYLATE 10 MG PO TABS: 10.0000 mg | ORAL_TABLET | Freq: Every day | ORAL | Status: AC

## 2020-12-26 MED ORDER — METOPROLOL SUCCINATE ER 200 MG PO TB24: 200.0000 mg | ORAL_TABLET | Freq: Every day | ORAL | Status: AC

## 2020-12-26 MED ORDER — TICAGRELOR 90 MG PO TABS: 90.0000 mg | ORAL_TABLET | Freq: Two times a day (BID) | ORAL | 0 refills | Status: DC

## 2020-12-26 MED ORDER — POTASSIUM CHLORIDE CRYS ER 20 MEQ PO TBCR
40.0000 meq | EXTENDED_RELEASE_TABLET | Freq: Once | ORAL | Status: AC
  Administered 2020-12-26: 40 meq via ORAL
  Filled 2020-12-26: qty 2

## 2020-12-26 MED ORDER — INSULIN ASPART 100 UNIT/ML ~~LOC~~ SOLN: SUBCUTANEOUS | 0 refills | Status: AC

## 2020-12-26 MED ORDER — HYDRALAZINE HCL 100 MG PO TABS: 100.0000 mg | ORAL_TABLET | Freq: Three times a day (TID) | ORAL | Status: AC

## 2020-12-26 MED ORDER — TICAGRELOR 90 MG PO TABS
90.0000 mg | ORAL_TABLET | Freq: Two times a day (BID) | ORAL | Status: DC
  Administered 2020-12-26: 90 mg via ORAL
  Filled 2020-12-26: qty 1

## 2020-12-26 MED ORDER — MAGNESIUM SULFATE IN D5W 1-5 GM/100ML-% IV SOLN
1.0000 g | Freq: Once | INTRAVENOUS | Status: AC
  Administered 2020-12-26: 1 g via INTRAVENOUS
  Filled 2020-12-26: qty 100

## 2020-12-26 MED FILL — BRILINTA 90 MG TABLET: 90 | 30 days supply | Qty: 60 | Fill #0

## 2020-12-26 NOTE — Discharge Instructions (Signed)
Follow with Primary MD Frazier Richards, MD in 7 days   Get CBC, CMP, 2 view Chest X ray -  checked next visit within 1 week by Primary MD   Activity: As tolerated with Full fall precautions use walker/cane & assistance as needed  Disposition Home   Diet: Heart Healthy Low Carb  Accuchecks 4 times/day, Once in AM empty stomach and then before each meal. Log in all results and show them to your Prim.MD in 3 days. If any glucose reading is under 80 or above 300 call your Prim MD immidiately. Follow Low glucose instructions for glucose under 80 as instructed.   Special Instructions: If you have smoked or chewed Tobacco  in the last 2 yrs please stop smoking, stop any regular Alcohol  and or any Recreational drug use.  On your next visit with your primary care physician please Get Medicines reviewed and adjusted.  Please request your Prim.MD to go over all Hospital Tests and Procedure/Radiological results at the follow up, please get all Hospital records sent to your Prim MD by signing hospital release before you go home.  If you experience worsening of your admission symptoms, develop shortness of breath, life threatening emergency, suicidal or homicidal thoughts you must seek medical attention immediately by calling 911 or calling your MD immediately  if symptoms less severe.  You Must read complete instructions/literature along with all the possible adverse reactions/side effects for all the Medicines you take and that have been prescribed to you. Take any new Medicines after you have completely understood and accpet all the possible adverse reactions/side effects.       Person Under Monitoring Name: Aaron Bonilla  Location: [address] [place] [address]   Infection Prevention Recommendations for Individuals Confirmed to have, or Being Evaluated for, 2019 Novel Coronavirus (COVID-19) Infection Who Receive Care at Home  Individuals who are confirmed to have, or  are being evaluated for, COVID-19 should follow the prevention steps below until a healthcare provider or local or state health department says they can return to normal activities.  Stay home except to get medical care You should restrict activities outside your home, except for getting medical care. Do not go to work, school, or public areas, and do not use public transportation or taxis.  Call ahead before visiting your doctor Before your medical appointment, call the healthcare provider and tell them that you have, or are being evaluated for, COVID-19 infection. This will help the healthcare providers office take steps to keep other people from getting infected. Ask your healthcare provider to call the local or state health department.  Monitor your symptoms Seek prompt medical attention if your illness is worsening (e.g., difficulty breathing). Before going to your medical appointment, call the healthcare provider and tell them that you have, or are being evaluated for, COVID-19 infection. Ask your healthcare provider to call the local or state health department.  Wear a facemask You should wear a facemask that covers your nose and mouth when you are in the same room with other people and when you visit a healthcare provider. People who live with or visit you should also wear a facemask while they are in the same room with you.  Separate yourself from other people in your home As much as possible, you should stay in a different room from other people in your home. Also, you should use a separate bathroom, if available.  Avoid sharing household items You should not  share dishes, drinking glasses, cups, eating utensils, towels, bedding, or other items with other people in your home. After using these items, you should wash them thoroughly with soap and water.  Cover your coughs and sneezes Cover your mouth and nose with a tissue when you cough or sneeze, or you can cough or sneeze  into your sleeve. Throw used tissues in a lined trash can, and immediately wash your hands with soap and water for at least 20 seconds or use an alcohol-based hand rub.  Wash your [facility] your hands often and thoroughly with soap and water for at least 20 seconds. You can use an alcohol-based hand sanitizer if soap and water are not available and if your hands are not visibly dirty. Avoid touching your eyes, nose, and mouth with unwashed hands.   Prevention Steps for Caregivers and Household Members of Individuals Confirmed to have, or Being Evaluated for, COVID-19 Infection Being Cared for in the Home  If you live with, or provide care at home for, a person confirmed to have, or being evaluated for, COVID-19 infection please follow these guidelines to prevent infection:  Follow healthcare providers instructions Make sure that you understand and can help the patient follow any healthcare provider instructions for all care.  Provide for the patients basic needs You should help the patient with basic needs in the home and provide support for getting groceries, prescriptions, and other personal needs.  Monitor the patients symptoms If they are getting sicker, call his or her medical provider and tell them that the patient has, or is being evaluated for, COVID-19 infection. This will help the healthcare providers office take steps to keep other people from getting infected. Ask the healthcare provider to call the local or state health department.  Limit the number of people who have contact with the patient  If possible, have only one caregiver for the patient.  Other household members should stay in another home or place of residence. If this is not possible, they should stay  in another room, or be separated from the patient as much as possible. Use a separate bathroom, if available.  Restrict visitors who do not have an essential need to be in the home.  Keep older adults,  very young children, and other sick people away from the patient Keep older adults, very young children, and those who have compromised immune systems or chronic health conditions away from the patient. This includes people with chronic heart, lung, or kidney conditions, diabetes, and cancer.  Ensure good ventilation Make sure that shared spaces in the home have good air flow, such as from an air conditioner or an opened window, weather permitting.  Wash your hands often  Wash your hands often and thoroughly with soap and water for at least 20 seconds. You can use an alcohol based hand sanitizer if soap and water are not available and if your hands are not visibly dirty.  Avoid touching your eyes, nose, and mouth with unwashed hands.  Use disposable paper towels to dry your hands. If not available, use dedicated cloth towels and replace them when they become wet.  Wear a facemask and gloves  Wear a disposable facemask at all times in the room and gloves when you touch or have contact with the patients blood, body fluids, and/or secretions or excretions, such as sweat, saliva, sputum, nasal mucus, vomit, urine, or feces.  Ensure the mask fits over your nose and mouth tightly, and do not touch it  during use.  Throw out disposable facemasks and gloves after using them. Do not reuse.  Wash your hands immediately after removing your facemask and gloves.  If your personal clothing becomes contaminated, carefully remove clothing and launder. Wash your hands after handling contaminated clothing.  Place all used disposable facemasks, gloves, and other waste in a lined container before disposing them with other household waste.  Remove gloves and wash your hands immediately after handling these items.  Do not share dishes, glasses, or other household items with the patient  Avoid sharing household items. You should not share dishes, drinking glasses, cups, eating utensils, towels, bedding, or  other items with a patient who is confirmed to have, or being evaluated for, COVID-19 infection.  After the person uses these items, you should wash them thoroughly with soap and water.  Wash laundry thoroughly  Immediately remove and wash clothes or bedding that have blood, body fluids, and/or secretions or excretions, such as sweat, saliva, sputum, nasal mucus, vomit, urine, or feces, on them.  Wear gloves when handling laundry from the patient.  Read and follow directions on labels of laundry or clothing items and detergent. In general, wash and dry with the warmest temperatures recommended on the label.  Clean all areas the individual has used often  Clean all touchable surfaces, such as counters, tabletops, doorknobs, bathroom fixtures, toilets, phones, keyboards, tablets, and bedside tables, every day. Also, clean any surfaces that may have blood, body fluids, and/or secretions or excretions on them.  Wear gloves when cleaning surfaces the patient has come in contact with.  Use a diluted bleach solution (e.g., dilute bleach with 1 part bleach and 10 parts water) or a household disinfectant with a label that says EPA-registered for coronaviruses. To make a bleach solution at home, add 1 tablespoon of bleach to 1 quart (4 cups) of water. For a larger supply, add  cup of bleach to 1 gallon (16 cups) of water.  Read labels of cleaning products and follow recommendations provided on product labels. Labels contain instructions for safe and effective use of the cleaning product including precautions you should take when applying the product, such as wearing gloves or eye protection and making sure you have good ventilation during use of the product.  Remove gloves and wash hands immediately after cleaning.  Monitor yourself for signs and symptoms of illness Caregivers and household members are considered close contacts, should monitor their health, and will be asked to limit movement  outside of the home to the extent possible. Follow the monitoring steps for close contacts listed on the symptom monitoring form.   ? If you have additional questions, contact your local health department or call the epidemiologist on call at [phone] (available 24/7). ? This guidance is subject to change. For the most up-to-date guidance from [facility], please refer to their website: [email or web address]

## 2020-12-26 NOTE — Discharge Summary (Signed)
Aaron Bonilla:[id number] DOB: 1946-07-19 DOA: 12/24/2020  PCP: Frazier Richards, MD  Admit date: 12/24/2020  Discharge date: 12/26/2020  Admitted From: Home   Disposition:  Home   Recommendations for Outpatient Follow-up:   Follow up with PCP in 1-2 weeks  PCP Please obtain BMP/CBC, 2 view CXR in 1week,  (see Discharge instructions)   PCP Please follow up on the following pending results: Blood pressure, glycemic control, LDL, check a CBC, CMP and a two-view chest x-ray in 1 week.  Outpatient neurology and vascular surgery follow-up recommended in 1 to 2 weeks post discharge.   Home Health: PT, OT, speech, RN Equipment/Devices: None Consultations: Neuro Discharge Condition: Stable    CODE STATUS: Full    Diet Recommendation: Heart Healthy Low Carb  Diet Order            Diet Carb Modified Fluid consistency: Thin; Room service appropriate? Yes  Diet effective now                  Chief Complaint  Patient presents with  . 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                                                                 Hospital Course     1. Acute posterior right periatrial white matter CVA - per wife stroke recently and was already on maximal treatment, continue dual antiplatelet therapy along with Lopid and statin.  Cussed in detail with Dr. Leonie Man stroke team on 12/26/2020 at 10 AM, he reviewed all imaging results.  Plan is aspirin and Brilinta for a month thereafter aspirin only, outpatient vascular surgery follow-up.  Cleared for discharge.  Currently no new focal deficits, no swallowing issues, will be seen by speech care prior to discharge as well.  Follow-up with Dr. Leonie Man in a few weeks post discharge.  2.  Incidental COVID-19 infection.  Mild cough, no shortness of breath, 2 doses of remdesivir, no further treatment required he is asymptomatic with flat inflammatory markers.  SpO2: 95 %  Recent Labs  Lab 12/24/20 1322 12/25/20 0135 12/26/20 0229  WBC 6.6 4.0 5.5  CRP  --  2.2* 1.8*  DDIMER  --  17.29* 0.48  BNP  --   --  92.2  AST 19 23 15   ALT 14 15 13   ALKPHOS 96 91 80  BILITOT 0.5 0.8 0.6  ALBUMIN 3.6 3.3* 3.1*      3.  Dyslipidemia.  On high-dose statin and Lopid.  4.  Essential hypertension.  For now permissive hypertension.  Blood pressure medications gradually will be reintroduced over the next few days, instructions provided.  5.  Metabolic encephalopathy.  Likely worse due to stroke, likely to improve at home, only minimally confused.  6.  AKI on CKD 4.  Baseline creatinine close to 3.7.    To baseline discontinuing ACE inhibitor and Glucophage upon discharge, follow with PCP.    7. DM Type II.    Stop Glucophage and oral hypoglycemic due to worsening renal function, continue home regimen with addition of sliding scale follow with PCP.  8.  Bilateral artery disease.  Outpatient vascular surgery follow-up.  Discharge diagnosis     Principal Problem:   Acute CVA (cerebrovascular accident) ([place]) Active Problems:   Carotid artery disease ([place])   Diabetes mellitus without complication  ([place])   Coronary artery disease   Generalized weakness   CKD (chronic kidney disease)   Gout   Hypertension    Discharge instructions    Discharge Instructions    Discharge instructions   Complete by: As directed    Follow with Primary MD Frazier Richards, MD in 7 days   Get CBC, CMP, 2 view Chest X ray -  checked next visit within 1 week by Primary MD   Activity: As tolerated with Full fall precautions use walker/cane & assistance as needed  Disposition Home   Diet: Heart Healthy Low Carb  Accuchecks 4 times/day, Once in AM empty stomach and then before each meal. Log in all results and show them to your Prim.MD in 3 days. If any glucose reading is under 80 or above 300 call your Prim MD immidiately. Follow Low glucose instructions for glucose under 80 as instructed.   Special Instructions: If you have smoked or chewed Tobacco  in the last 2 yrs please stop smoking, stop any regular Alcohol  and or any Recreational drug use.  On your next visit with your primary care physician please Get Medicines reviewed and adjusted.  Please request your Prim.MD to go over all Hospital Tests and Procedure/Radiological results at the follow up, please get all Hospital records sent to your Prim MD by signing hospital release before you go home.  If you experience worsening of your admission symptoms, develop shortness of breath, life threatening emergency, suicidal or homicidal thoughts you must seek medical attention immediately by calling 911 or calling your MD immediately  if symptoms less severe.  You Must read complete instructions/literature along with all the possible adverse reactions/side effects for all the Medicines you take and that have been prescribed to you. Take any new Medicines after you have completely understood and accpet all the possible adverse reactions/side effects.   Increase activity slowly   Complete by: As directed       Discharge Medications   Allergies as  of 12/26/2020      Reactions   Hydrochlorothiazide Other (See Comments)   unknown   Niacin Hives   Contrast Media [iodinated Diagnostic Agents] Hives, Other (See Comments)   Shivering   Clopidogrel Nausea And Vomiting   Oxycodone  Nausea Only      Medication List    STOP taking these medications   clopidogrel 75 MG tablet Commonly known as: PLAVIX   glipiZIDE 10 MG 24 hr tablet Commonly known as: GLUCOTROL XL   lisinopril 40 MG tablet Commonly known as: ZESTRIL   metFORMIN 1000 MG tablet Commonly known as: GLUCOPHAGE     TAKE these medications   allopurinol 100 MG tablet Commonly known as: ZYLOPRIM Take 100 mg by mouth daily.   amLODipine 10 MG tablet Commonly known as: NORVASC Take 1 tablet (10 mg total) by mouth daily. Start taking on: December 27, 2020   aspirin EC 81 MG tablet Take 81 mg by mouth daily.   atorvastatin 80 MG tablet Commonly known as: LIPITOR Take 80 mg by mouth at bedtime.   cholecalciferol 25 MCG (1000 UNIT) tablet Commonly known as: VITAMIN D3 Take 1,000 Units by mouth daily.   cloNIDine 0.1 mg/24hr patch Commonly known as: CATAPRES - Dosed in mg/24 hr Place 0.1 mg onto the skin once a week.   gemfibrozil 600 MG tablet Commonly known as: LOPID Take 600 mg by mouth 2 (two) times daily.   hydrALAZINE 100 MG tablet Commonly known as: APRESOLINE Take 1 tablet (100 mg total) by mouth 3 (three) times daily. Start taking on: December 28, 2020 What changed: These instructions start on December 28, 2020. If you are unsure what to do until then, ask your doctor or other care provider.   insulin aspart 100 UNIT/ML injection Commonly known as: NovoLOG Substitute to any brand approved.Before each meal 3 times a day, 140-199 - 2 units, 200-250 - 4 units, 251-299 - 6 units,  300-349 - 8 units,  350 or above 10 units. Dispense syringes and needles as needed, Ok to switch to PEN if approved. DX DM2, Code E11.65   insulin detemir 100 UNIT/ML  injection Commonly known as: LEVEMIR Inject 20 Units into the skin 2 (two) times daily.   Jardiance 10 MG Tabs tablet Generic drug: empagliflozin Take 10 mg by mouth daily.   metoprolol 200 MG 24 hr tablet Commonly known as: TOPROL-XL Take 1 tablet (200 mg total) by mouth daily. Start taking on: December 27, 2020   mupirocin ointment 2 % Commonly known as: BACTROBAN Place 1 application into the nose 3 (three) times daily.   omeprazole 20 MG capsule Commonly known as: PRILOSEC Take 20 mg by mouth daily.   pregabalin 75 MG capsule Commonly known as: LYRICA Take 25 mg by mouth every evening.   sodium bicarbonate 650 MG tablet Take 650 mg by mouth daily.   ticagrelor 90 MG Tabs tablet Commonly known as: BRILINTA Take 1 tablet (90 mg total) by mouth 2 (two) times daily.   vitamin B-12 1000 MCG tablet Commonly known as: CYANOCOBALAMIN Take 1,000 mcg by mouth daily.        Follow-up Information    Frazier Richards, MD. Schedule an appointment as soon as possible for a visit in 1 week(s).   Specialty: Family Medicine Contact information: [place] [address] [phone]        Garvin Fila, MD. Schedule an appointment as soon as possible for a visit in 1 week(s).   Specialties: Neurology, Radiology Contact information: [address] [place] [address] [phone]        Waynetta Sandy, MD. Schedule an appointment as soon as possible for a visit in 2 week(s).   Specialties: Vascular Surgery, Cardiology Why:  Carotid artery disease Contact information: [place] [address] [phone]               Major procedures and Radiology Reports - PLEASE review detailed and final reports thoroughly  -        CT Head Wo Contrast  Result Date: 12/24/2020 CLINICAL DATA:  Neurological defect, suspected stroke over 65 hours old, fell last night at 2230 hours, RIGHT leg not working correctly, past  history of stroke, hypertension, diabetes mellitus, former smoker EXAM: CT HEAD WITHOUT CONTRAST TECHNIQUE: Contiguous axial images were obtained from the base of the skull through the vertex without intravenous contrast. Sagittal and coronal MPR images reconstructed from axial data set. COMPARISON:  01/25/2020 FINDINGS: Brain: Minor scattered motion artifacts. Normal ventricular morphology. No midline shift or mass effect. Small vessel chronic ischemic changes of deep cerebral white matter. No intracranial hemorrhage or mass lesion. Area of white matter hypoattenuation at anterior aspect of LEFT thalamus consistent with lacunar infarct, new. No areas of cortical infarction identified. Vascular: No hyperdense vessels. Minimal atherosclerotic calcification of internal carotid arteries at skull base Skull: Intact Sinuses/Orbits: Clear Other: N/A IMPRESSION: Atrophy with small vessel chronic ischemic changes of deep cerebral white matter. New lacunar infarct at anterior aspect of LEFT thalamus. No other intracranial abnormalities. Electronically Signed   By: Lavonia Dana M.D.   On: 12/24/2020 12:37   MR ANGIO HEAD WO CONTRAST  Result Date: 12/24/2020 CLINICAL DATA:  Initial evaluation for acute stroke, left lower extremity weakness. History of prior strokes. EXAM: MRI HEAD WITHOUT CONTRAST MRA HEAD WITHOUT CONTRAST TECHNIQUE: Multiplanar, multiecho pulse sequences of the brain and surrounding structures were obtained without intravenous contrast. Angiographic images of the head were obtained using MRA technique without contrast. COMPARISON:  Prior CT from earlier the same day. FINDINGS: MRI HEAD FINDINGS Brain: Examination degraded by motion artifact. Generalized age-related cerebral atrophy. Patchy and confluent T2/FLAIR hyperintensity within the periventricular deep white matter both cerebral hemispheres, most consistent with chronic small vessel ischemic disease, mild to moderate in nature. Few scattered  superimposed remote lacunar infarcts present about the right basal ganglia, left thalamus, and midbrain. 1 cm linear focus of restricted diffusion seen involving the posterior right periatrial white matter, consistent with an acute ischemic infarct (series 9, image 22). No associated hemorrhage or mass effect. No other diffusion abnormality to suggest acute or subacute ischemia. Gray-white matter differentiation otherwise maintained. No acute intracranial hemorrhage. Subcentimeter focus of susceptibility artifact within the adjacent right periatrial white matter consistent with a small chronic microhemorrhage, likely small vessel related. No other evidence for acute or chronic intracranial hemorrhage. No mass lesion, midline shift or mass effect. No hydrocephalus or extra-axial fluid collection. Pituitary gland suprasellar region within normal limits. Midline structures intact. Vascular: Absent flow void within the right ICA to the level of the terminus, consistent with occlusion. Major intracranial vascular flow voids are otherwise maintained. Skull and upper cervical spine: Craniocervical junction within normal limits. Bone marrow signal intensity normal. Small lipoma noted at the right frontal scalp. Scalp soft tissues otherwise unremarkable. Sinuses/Orbits: Globes and orbital soft tissues demonstrate no acute finding. Mild mucosal thickening noted within the ethmoidal air cells. Paranasal sinuses are otherwise clear. No significant mastoid effusion. Other: None. MRA HEAD FINDINGS ANTERIOR CIRCULATION: Examination moderately to severely degraded by motion artifact. Absent flow void within the right ICA to the level of the terminus, consistent with occlusion. This was evident on a prior carotid ultrasound from 01/25/2020, consistent with a chronic finding.  Left ICA widely patent to the terminus without stenosis. A1 segments patent bilaterally. Grossly normal anterior communicating artery complex. Anterior cerebral  arteries perfused to their distal aspects without appreciable stenosis. Left M1 segment widely patent. Normal left MCA bifurcation. Distal left MCA branches well perfused. Attenuated but patent flow within the right M1 segment, collateral nature across the circle-of-Willis and/or from the posterior circulation via a small right posterior communicating artery. Grossly normal right MCA bifurcation. Right MCA branches well perfused, although demonstrate attenuated flow related to the chronic right ICA occlusion. POSTERIOR CIRCULATION: Both V4 segments patent to the vertebrobasilar junction without stenosis. Left vertebral dominant. Both PICA origins patent and normal. Basilar patent to its distal aspect without stenosis. Superior cerebellar arteries patent bilaterally. Both PCAs primarily supplied via the basilar. Multifocal irregularity throughout the P2 segments could be related to motion and/or atherosclerotic disease. PCAs remain well perfused to their distal aspects. No visible aneurysm or other vascular abnormality on this motion degraded exam. IMPRESSION: MRI HEAD IMPRESSION: 1. 1 cm acute ischemic nonhemorrhagic infarct involving the posterior right periatrial white matter. 2. Underlying age-related cerebral atrophy with mild to moderate chronic microvascular ischemic disease, with a few scattered remote lacunar infarcts involving the right basal ganglia, left thalamus, and midbrain. MRA HEAD IMPRESSION: 1. Technically limited exam due to extensive motion artifact. 2. 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 Right MCA perfused via collateral flow across the circle-of-Willis. 3. Otherwise gross patency of the major intracranial arterial circulation. No visible proximal high-grade or correctable stenosis. Electronically Signed   By: Jeannine Boga M.D.   On: 12/24/2020 19:03   MR BRAIN WO CONTRAST  Result Date: 12/24/2020 CLINICAL DATA:  Initial evaluation for acute stroke, left lower  extremity weakness. History of prior strokes. EXAM: MRI HEAD WITHOUT CONTRAST MRA HEAD WITHOUT CONTRAST TECHNIQUE: Multiplanar, multiecho pulse sequences of the brain and surrounding structures were obtained without intravenous contrast. Angiographic images of the head were obtained using MRA technique without contrast. COMPARISON:  Prior CT from earlier the same day. FINDINGS: MRI HEAD FINDINGS Brain: Examination degraded by motion artifact. Generalized age-related cerebral atrophy. Patchy and confluent T2/FLAIR hyperintensity within the periventricular deep white matter both cerebral hemispheres, most consistent with chronic small vessel ischemic disease, mild to moderate in nature. Few scattered superimposed remote lacunar infarcts present about the right basal ganglia, left thalamus, and midbrain. 1 cm linear focus of restricted diffusion seen involving the posterior right periatrial white matter, consistent with an acute ischemic infarct (series 9, image 22). No associated hemorrhage or mass effect. No other diffusion abnormality to suggest acute or subacute ischemia. Gray-white matter differentiation otherwise maintained. No acute intracranial hemorrhage. Subcentimeter focus of susceptibility artifact within the adjacent right periatrial white matter consistent with a small chronic microhemorrhage, likely small vessel related. No other evidence for acute or chronic intracranial hemorrhage. No mass lesion, midline shift or mass effect. No hydrocephalus or extra-axial fluid collection. Pituitary gland suprasellar region within normal limits. Midline structures intact. Vascular: Absent flow void within the right ICA to the level of the terminus, consistent with occlusion. Major intracranial vascular flow voids are otherwise maintained. Skull and upper cervical spine: Craniocervical junction within normal limits. Bone marrow signal intensity normal. Small lipoma noted at the right frontal scalp. Scalp soft tissues  otherwise unremarkable. Sinuses/Orbits: Globes and orbital soft tissues demonstrate no acute finding. Mild mucosal thickening noted within the ethmoidal air cells. Paranasal sinuses are otherwise clear. No significant mastoid effusion. Other: None. MRA HEAD FINDINGS  ANTERIOR CIRCULATION: Examination moderately to severely degraded by motion artifact. Absent flow void within the right ICA to the level of the terminus, consistent with occlusion. This was evident on a prior carotid ultrasound from 01/25/2020, consistent with a chronic finding. Left ICA widely patent to the terminus without stenosis. A1 segments patent bilaterally. Grossly normal anterior communicating artery complex. Anterior cerebral arteries perfused to their distal aspects without appreciable stenosis. Left M1 segment widely patent. Normal left MCA bifurcation. Distal left MCA branches well perfused. Attenuated but patent flow within the right M1 segment, collateral nature across the circle-of-Willis and/or from the posterior circulation via a small right posterior communicating artery. Grossly normal right MCA bifurcation. Right MCA branches well perfused, although demonstrate attenuated flow related to the chronic right ICA occlusion. POSTERIOR CIRCULATION: Both V4 segments patent to the vertebrobasilar junction without stenosis. Left vertebral dominant. Both PICA origins patent and normal. Basilar patent to its distal aspect without stenosis. Superior cerebellar arteries patent bilaterally. Both PCAs primarily supplied via the basilar. Multifocal irregularity throughout the P2 segments could be related to motion and/or atherosclerotic disease. PCAs remain well perfused to their distal aspects. No visible aneurysm or other vascular abnormality on this motion degraded exam. IMPRESSION: MRI HEAD IMPRESSION: 1. 1 cm acute ischemic nonhemorrhagic infarct involving the posterior right periatrial white matter. 2. Underlying age-related cerebral atrophy  with mild to moderate chronic microvascular ischemic disease, with a few scattered remote lacunar infarcts involving the right basal ganglia, left thalamus, and midbrain. MRA HEAD IMPRESSION: 1. Technically limited exam due to extensive motion artifact. 2. 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 Right MCA perfused via collateral flow across the circle-of-Willis. 3. Otherwise gross patency of the major intracranial arterial circulation. No visible proximal high-grade or correctable stenosis. Electronically Signed   By: Jeannine Boga M.D.   On: 12/24/2020 19:03   DG CHEST PORT 1 VIEW  Result Date: 12/24/2020 CLINICAL DATA:  Coronavirus infection. EXAM: PORTABLE CHEST 1 VIEW COMPARISON:  None. FINDINGS: Heart size is normal. Mediastinal shadows are normal. The lungs are clear except for mild atelectasis or scarring at the medial lung bases. No consolidation or lobar collapse. No effusion. No significant bone finding. IMPRESSION: Mild atelectasis or scarring at the medial lung bases. Electronically Signed   By: Nelson Chimes M.D.   On: 12/24/2020 15:13   ECHOCARDIOGRAM COMPLETE  Result Date: 12/25/2020    ECHOCARDIOGRAM REPORT   Patient Name:   Aaron Bonilla Date of Exam: 12/25/2020 Medical Rec #:  [id number]      Height:       71.0 in Accession #:    [id number]     Weight:       200.0 lb Date of Birth:  [birth year]     BSA:          2.109 m Patient Age:    28 years       BP:           177/90 mmHg Patient Gender: M              HR:           68 bpm. Exam Location:  Inpatient Procedure: 2D Echo, Cardiac Doppler and Color Doppler Indications:    Stroke 434.91 / I163.9  History:        Patient has no prior history of Echocardiogram examinations.                 CAD; Risk Factors:Hypertension and Diabetes.  Renal Disorder.                 Gout. COVID-19 Positive.  Sonographer:    Jonelle Sidle Dance Referring Phys: [place]  1. Left ventricular ejection fraction, by  estimation, is 65 to 70%. The left ventricle has normal function. The left ventricle has no regional wall motion abnormalities. There is moderate concentric left ventricular hypertrophy. Left ventricular diastolic parameters are consistent with Grade I diastolic dysfunction (impaired relaxation). Elevated left atrial pressure.  2. Right ventricular systolic function is normal. The right ventricular size is normal. There is normal pulmonary artery systolic pressure.  3. The mitral valve is normal in structure. No evidence of mitral valve regurgitation. No evidence of mitral stenosis.  4. The aortic valve is normal in structure. Aortic valve regurgitation is not visualized. No aortic stenosis is present.  5. The inferior vena cava is normal in size with greater than 50% respiratory variability, suggesting right atrial pressure of 3 mmHg. Comparison(s): No prior Echocardiogram. Conclusion(s)/Recommendation(s): No intracardiac source of embolism detected on this transthoracic study. A transesophageal echocardiogram is recommended to exclude cardiac source of embolism if clinically indicated. FINDINGS  Left Ventricle: Left ventricular ejection fraction, by estimation, is 65 to 70%. The left ventricle has normal function. The left ventricle has no regional wall motion abnormalities. The left ventricular internal cavity size was normal in size. There is  moderate concentric left ventricular hypertrophy. Left ventricular diastolic parameters are consistent with Grade I diastolic dysfunction (impaired relaxation). Elevated left atrial pressure. Right Ventricle: The right ventricular size is normal. No increase in right ventricular wall thickness. Right ventricular systolic function is normal. There is normal pulmonary artery systolic pressure. Left Atrium: Left atrial size was normal in size. Right Atrium: Right atrial size was normal in size. Pericardium: There is no evidence of pericardial effusion. Mitral Valve: The  mitral valve is normal in structure. No evidence of mitral valve regurgitation. No evidence of mitral valve stenosis. Tricuspid Valve: The tricuspid valve is normal in structure. Tricuspid valve regurgitation is mild . No evidence of tricuspid stenosis. Aortic Valve: The aortic valve is normal in structure. Aortic valve regurgitation is not visualized. No aortic stenosis is present. Pulmonic Valve: The pulmonic valve was normal in structure. Pulmonic valve regurgitation is not visualized. No evidence of pulmonic stenosis. Aorta: The aortic root is normal in size and structure. Venous: The inferior vena cava is normal in size with greater than 50% respiratory variability, suggesting right atrial pressure of 3 mmHg. IAS/Shunts: No atrial level shunt detected by color flow Doppler.  LEFT VENTRICLE PLAX 2D LVIDd:         4.30 cm  Diastology LVIDs:         3.50 cm  LV e' medial:    3.37 cm/s LV PW:         1.40 cm  LV E/e' medial:  14.5 LV IVS:        1.30 cm  LV e' lateral:   6.53 cm/s LVOT diam:     2.10 cm  LV E/e' lateral: 7.5 LV SV:         68 LV SV Index:   32 LVOT Area:     3.46 cm  RIGHT VENTRICLE             IVC RV Basal diam:  2.10 cm     IVC diam: 1.70 cm RV S prime:     12.70 cm/s TAPSE (M-mode): 2.3 cm LEFT ATRIUM  Index       RIGHT ATRIUM           Index LA diam:        3.80 cm 1.80 cm/m  RA Area:     10.10 cm LA Vol (A2C):   53.7 ml 25.47 ml/m RA Volume:   18.60 ml  8.82 ml/m LA Vol (A4C):   41.9 ml 19.87 ml/m LA Biplane Vol: 46.9 ml 22.24 ml/m  AORTIC VALVE LVOT Vmax:   91.70 cm/s LVOT Vmean:  60.100 cm/s LVOT VTI:    0.196 m  AORTA Ao Root diam: 3.80 cm Ao Asc diam:  3.40 cm MITRAL VALVE MV Area (PHT): 2.17 cm    SHUNTS MV Decel Time: 349 msec    Systemic VTI:  0.20 m MV E velocity: 48.70 cm/s  Systemic Diam: 2.10 cm MV A velocity: 84.60 cm/s MV E/A ratio:  0.58 Ena Dawley MD Electronically signed by Ena Dawley MD Signature Date/Time: 12/25/2020/7:42:48 PM    Final    VAS US  CAROTID  Result Date: 12/25/2020 Carotid Arterial Duplex Study Indications:       CVA and LLE weakness. Comparison Study:  Prior carotid from 01/25/20. RT ICA occluded & LT ICA showed >                    50% stenosis. Performing Technologist: Rogelia Rohrer  Examination Guidelines: A complete evaluation includes B-mode imaging, spectral Doppler, color Doppler, and power Doppler as needed of all accessible portions of each vessel. Bilateral testing is considered an integral part of a complete examination. Limited examinations for reoccurring indications may be performed as noted.  Right Carotid Findings: +----------+--------+--------+--------+------------------+--------------+           PSV cm/sEDV cm/sStenosisPlaque DescriptionComments       +----------+--------+--------+--------+------------------+--------------+ CCA Prox  53      0                                                +----------+--------+--------+--------+------------------+--------------+ CCA Distal45      0               heterogenous                     +----------+--------+--------+--------+------------------+--------------+ ICA Prox                  Occluded                                 +----------+--------+--------+--------+------------------+--------------+ ICA Distal                                          Not visualized +----------+--------+--------+--------+------------------+--------------+ ECA       156     8                                                +----------+--------+--------+--------+------------------+--------------+ +----------+--------+-------+----------------+-------------------+           PSV cm/sEDV cmsDescribe        Arm Pressure (mmHG) +----------+--------+-------+----------------+-------------------+ [id number]  0      Multiphasic, WNL                    +----------+--------+-------+----------------+-------------------+ +---------+--------+--+--------+-+---------+  VertebralPSV cm/s31EDV cm/s7Antegrade +---------+--------+--+--------+-+---------+  Left Carotid Findings: +----------+--------+--------+--------+------------------+------------------+           PSV cm/sEDV cm/sStenosisPlaque DescriptionComments           +----------+--------+--------+--------+------------------+------------------+ CCA Prox  86      11                                intimal thickening +----------+--------+--------+--------+------------------+------------------+ CCA Distal63      13                                                   +----------+--------+--------+--------+------------------+------------------+ ICA Prox  93      22      40-59%  heterogenous                         +----------+--------+--------+--------+------------------+------------------+ ICA Distal67      18                                                   +----------+--------+--------+--------+------------------+------------------+ ECA       112     0                                                    +----------+--------+--------+--------+------------------+------------------+ +----------+--------+--------+----------------+-------------------+           PSV cm/sEDV cm/sDescribe        Arm Pressure (mmHG) +----------+--------+--------+----------------+-------------------+ Subclavian132     0       Multiphasic, WNL                    +----------+--------+--------+----------------+-------------------+ +---------+--------+--+--------+-+---------+ VertebralPSV cm/s36EDV cm/s9Antegrade +---------+--------+--+--------+-+---------+   Summary: Right Carotid: Evidence consistent with a total occlusion of the right ICA. Left Carotid: Velocities in the left ICA are consistent with a 40-59% stenosis. Vertebrals:  Bilateral vertebral arteries demonstrate antegrade flow. Subclavians: Normal flow hemodynamics were seen in bilateral subclavian              arteries. *See table(s)  above for measurements and observations.     Preliminary     Micro Results    No results found for this or any previous visit (from the past 240 hour(s)).  Today   Subjective    Aaron Bonilla today has no headache,no chest abdominal pain,no new weakness tingling or numbness, feels much better wants to go home today.     Objective   Blood pressure (!) 160/85, pulse 65, temperature 98.2 F (36.8 C), temperature source Oral, resp. rate 14, height 5\' 11"  (1.803 m), weight 90.7 kg, SpO2 95 %.   Intake/Output Summary (Last 24 hours) at 12/26/2020 1001 Last data filed at 12/25/2020 2136 Gross per 24 hour  Intake 690 ml  Output -  Net 690 ml    Exam  Awake mildly confused, No new F.N  deficits,   [place].AT,PERRAL Supple Neck,No JVD, No cervical lymphadenopathy appriciated.  Symmetrical Chest wall movement, Good air movement bilaterally, CTAB RRR,No Gallops,Rubs or new Murmurs, No Parasternal Heave +ve B.Sounds, Abd Soft, Non tender, No organomegaly appriciated, No rebound -guarding or rigidity. No Cyanosis, Clubbing or edema, No new Rash or bruise   Data Review   CBC w Diff:  Lab Results  Component Value Date   WBC 5.5 12/26/2020   HGB 11.4 (L) 12/26/2020   HCT 34.3 (L) 12/26/2020   PLT 171 12/26/2020   LYMPHOPCT 26 12/26/2020   MONOPCT 8 12/26/2020   EOSPCT 0 12/26/2020   BASOPCT 0 12/26/2020    CMP:  Lab Results  Component Value Date   NA 139 12/26/2020   K 3.3 (L) 12/26/2020   CL 107 12/26/2020   CO2 17 (L) 12/26/2020   BUN 48 (H) 12/26/2020   CREATININE 3.27 (H) 12/26/2020   PROT 6.3 (L) 12/26/2020   ALBUMIN 3.1 (L) 12/26/2020   BILITOT 0.6 12/26/2020   ALKPHOS 80 12/26/2020   AST 15 12/26/2020   ALT 13 12/26/2020  . Lab Results  Component Value Date   HGBA1C 6.3 (H) 12/24/2020   Lipid Panel     Component Value Date/Time   CHOL 187 12/25/2020 0719   TRIG 328 (H) 12/25/2020 0719   HDL 23 (L) 12/25/2020 0719   CHOLHDL 8.1 12/25/2020 0719   VLDL 66 (H)  12/25/2020 0719   LDLCALC 98 12/25/2020 0719     Total Time in preparing paper work, data evaluation and todays exam - 26 minutes  Lala Lund M.D on 12/26/2020 at 10:01 AM  [facility]

## 2020-12-26 NOTE — Progress Notes (Signed)
Physical Therapy Treatment Patient Details Name: Aaron Bonilla MRN: [id number] DOB: 1946-01-02 Today's Date: 12/26/2020    History of Present Illness 75 y.o. 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    PT Comments    Pt received in bed, encouragement needed to participate in therapy. Pt required min assist supine to sit, min guard assist sit to stand, and min guard assist ambulation 200' with RW. Pt in recliner with feet elevated at end of session. Pt does have a rollator at home for ambulation. No further DME needs.    Follow Up Recommendations  Home health PT;Supervision/Assistance - 24 hour     Equipment Recommendations  None recommended by PT    Recommendations for Other Services       Precautions / Restrictions Precautions Precautions: Fall    Mobility  Bed Mobility Overal bed mobility: Needs Assistance Bed Mobility: Supine to Sit     Supine to sit: Min assist;HOB elevated     General bed mobility comments: +rail, increased time  Transfers Overall transfer level: Needs assistance Equipment used: Rolling walker (2 wheeled) Transfers: Sit to/from Stand Sit to Stand: Min guard         General transfer comment: min gaurd for safety  Ambulation/Gait Ambulation/Gait assistance: Min guard Gait Distance (Feet): 200 Feet Assistive device: Rolling walker (2 wheeled)   Gait velocity: decreased Gait velocity interpretation: 1.31 - 2.62 ft/sec, indicative of limited community ambulator General Gait Details: min guard assist for safety. Mobilized on RA. 1/4 DOE. No knee buckling noted. Steady gait with RW.   Stairs             Wheelchair Mobility    Modified Rankin (Stroke Patients Only) Modified Rankin (Stroke Patients Only) Pre-Morbid  Rankin Score: No symptoms Modified Rankin: Moderately severe disability     Balance Overall balance assessment: Needs assistance Sitting-balance support: No upper extremity supported;Feet supported Sitting balance-Leahy Scale: Good     Standing balance support: No upper extremity supported;During functional activity;Bilateral upper extremity supported Standing balance-Leahy Scale: Fair Standing balance comment: RW for ambulation                            Cognition Arousal/Alertness: Awake/alert Behavior During Therapy: WFL for tasks assessed/performed;Impulsive Overall Cognitive Status: No family/caregiver present to determine baseline cognitive functioning Area of Impairment: Attention;Orientation;Memory;Following commands;Safety/judgement;Awareness;Problem solving                 Orientation Level: Disoriented to;Time;Situation Current Attention Level: Sustained Memory: Decreased short-term memory Following Commands: Follows one step commands consistently Safety/Judgement: Decreased awareness of deficits;Decreased awareness of safety Awareness: Intellectual Problem Solving: Requires verbal cues;Requires tactile cues General Comments: stands impulsively from EOB      Exercises      General Comments General comments (skin integrity, edema, etc.): VSS on RA      Pertinent Vitals/Pain Pain Assessment: No/denies pain    Home Living                      Prior Function            PT Goals (current goals can now be found in the care plan section) Acute Rehab PT Goals Patient Stated Goal: home Progress towards PT goals: Progressing toward goals  Frequency    Min 4X/week      PT Plan Current plan remains appropriate    Co-evaluation              AM-PAC PT "6 Clicks" Mobility   Outcome Measure  Help needed turning from your back to your side while in a flat bed without using bedrails?: A Little Help needed moving from  lying on your back to sitting on the side of a flat bed without using bedrails?: A Little Help needed moving to and from a bed to a chair (including a wheelchair)?: A Little Help needed standing up from a chair using your arms (e.g., wheelchair or bedside chair)?: A Little Help needed to walk in hospital room?: A Little Help needed climbing 3-5 steps with a railing? : A Lot 6 Click Score: 17    End of Session Equipment Utilized During Treatment: Gait belt Activity Tolerance: Patient tolerated treatment well Patient left: in chair;with call bell/phone within reach;with chair alarm set Nurse Communication: Mobility status PT Visit Diagnosis: Other abnormalities of gait and mobility (R26.89);Difficulty in walking, not elsewhere classified (R26.2)     Time: 0940-1006 PT Time Calculation (min) (ACUTE ONLY): 26 min  Charges:  $Gait Training: 23-37 mins                     Lorrin Goodell, [place]  Office # [phone] Pager [phone]    Lorriane Shire 12/26/2020, 10:46 AM

## 2020-12-26 NOTE — Progress Notes (Signed)
SLP Cancellation Note  Patient Details Name: COALTON ARCH MRN: [id number] DOB: 1946/04/18   Cancelled treatment:       Reason Eval/Treat Not Completed: Other (comment). Pt unable to be seen for cognitive linguistic eval today. Cognitive deficits have been noted in chart, recommend f/u with Home health SLP for full cognitive linguistic evaluation.    Salathiel Ferrara, Katherene Ponto 12/26/2020, 12:18 PM

## 2020-12-26 NOTE — TOC Transition Note (Signed)
Transition of Care [facility]) - CM/SW Discharge Note   Patient Details  Name: Aaron Bonilla MRN: [id number] Date of Birth: [birth year]  Transition of Care [facility]) CM/SW Contact:  Verdell Carmine, RN Phone Number: 12/26/2020, 12:14 PM   Clinical Narrative:    Called patient, but did not answer concerning discharge, Called and spoke to wife [place], she is concerned about co pays and cost of [place]. She would like a [place] agency to work with [employer], [place], called Adela Lank at [place] He is checking with the [place] office.   Final next level of care: [place] Barriers to Discharge:  (wife is concerned about affording HOme health)   Patient Goals and CMS Choice     Choice offered to / list presented to : Spouse  Discharge Placement                       Discharge Plan and Services                          HH Arranged: RN,PT,OT,Speech Therapy   Date HH Agency Contacted: 12/26/20 Time [place]: 1214 Representative spoke with at [place]: Erick Blinks  Social Determinants of Health ([place]) Interventions     Readmission Risk Interventions No flowsheet data found.

## 2020-12-26 NOTE — Progress Notes (Shared)
STROKE TEAM PROGRESS NOTE   INTERVAL HISTORY ***  Vitals:   12/25/20 1710 12/25/20 2032 12/26/20 0414 12/26/20 0801  BP: (!) 178/80 (!) 162/80 (!) 160/85   Pulse: (!) 58 67 65   Resp: 16 18 14 20   Temp: 97.7 F (36.5 C) 98.1 F (36.7 C) 98.2 F (36.8 C)   TempSrc: Oral Oral Oral   SpO2:  96% 95% 99%  Weight:      Height:       CBC:  Recent Labs  Lab 12/25/20 0135 12/26/20 0229  WBC 4.0 5.5  NEUTROABS 3.2 3.6  HGB 11.9* 11.4*  HCT 35.3* 34.3*  MCV 80.4 79.8*  PLT 143* 539   Basic Metabolic Panel:  Recent Labs  Lab 12/25/20 0135 12/26/20 0229  NA 139 139  K 4.3 3.3*  CL 109 107  CO2 15* 17*  GLUCOSE 258* 130*  BUN 41* 48*  CREATININE 3.28* 3.27*  CALCIUM 9.6 9.4  MG 1.8 1.7  PHOS 3.7  --    Lipid Panel:  Recent Labs  Lab 12/25/20 0719  CHOL 187  TRIG 328*  HDL 23*  CHOLHDL 8.1  VLDL 66*  LDLCALC 98   HgbA1c:  Recent Labs  Lab 12/24/20 1322  HGBA1C 6.3*   Urine Drug Screen: No results for input(s): LABOPIA, COCAINSCRNUR, LABBENZ, AMPHETMU, THCU, LABBARB in the last 168 hours.  Alcohol Level No results for input(s): ETH in the last 168 hours.   Carotid duplex:  Summary:  Right Carotid: Evidence consistent with a total occlusion of the right  ICA.   Left Carotid: Velocities in the left ICA are consistent with a 40-59%  stenosis.   Vertebrals: Bilateral vertebral arteries demonstrate antegrade flow.  Subclavians: Normal flow hemodynamics were seen in bilateral subclavian        arteries.   2D Echo: IMPRESSIONS    1. Left ventricular ejection fraction, by estimation, is 65 to 70%. The  left ventricle has normal function. The left ventricle has no regional  wall motion abnormalities. There is moderate concentric left ventricular  hypertrophy. Left ventricular  diastolic parameters are consistent with Grade I diastolic dysfunction  (impaired relaxation). Elevated left atrial pressure.  2. Right ventricular systolic function is  normal. The right ventricular  size is normal. There is normal pulmonary artery systolic pressure.  3. The mitral valve is normal in structure. No evidence of mitral valve  regurgitation. No evidence of mitral stenosis.  4. The aortic valve is normal in structure. Aortic valve regurgitation is  not visualized. No aortic stenosis is present.  5. The inferior vena cava is normal in size with greater than 50%  respiratory variability, suggesting right atrial pressure of 3 mmHg.  IMAGING past 24 hours No results found.   PHYSICAL EXAM ***  ASSESSMENT/PLAN  Aaron Bonilla is a 75 y.o. male with PMH significant for diabetes, hypertension, coronary artery disease, gout who woke up with right leg weakness on 1/6. MRI brain revealed a 1 cm acute ischemic infarct in the posterior right periatrial white matter along with  remote right basal ganglia, left thalamus and midbrain lacunar infarcts . He is admitted with encephalopathy and R leg weakness and found to be Covid positive.  Stroke*** SVD, Chronic right  ICA occlusion   MRA head without contrast redemonstrates chronic right ICA occlusion at the level of the terminus.  Right MCA is perfused via collateral flow across the circle of Willis.  No other significant arterial abnormality.  TTE : 65-70% EF, elevated left atrial pressure  Carotid Doppler: total occlusion right ICA  LDL 98  HgbA1c 6.3     Diet   Diet Carb Modified Fluid consistency: Thin; Room service appropriate? Yes     On plavix and 81mg  ASA prior to admission  Therapy recommendations:  ***  Disposition:  ***  Hypertension  Home meds:  Clonidine, norvasc, apresoline, zestril, toprol XL,  . Permissive hypertension (OK if < 220/120) but gradually normalize in 5-7 days . Long-term BP goal normotensive  Hyperlipidemia  LDL 98, goal < 70  Lipitor 80mg  PTA  Continue statin at discharge  Diabetes Lab Results  Component Value Date   HGBA1C 6.3 (H)  12/24/2020   On levemir insulin, glucophage, jardiance  Other Stroke Risk Factors  Advanced Age >/= 42   Former cigarette smoker  Hx stroke  Coronary artery disease  Other Active Problems Hospital day # 2   To contact Stroke Continuity provider, please refer to [email or web address] After hours, contact General Neurology

## 2021-03-25 ENCOUNTER — Other Ambulatory Visit (HOSPITAL_COMMUNITY): Payer: Self-pay | Admitting: Family Medicine

## 2021-03-25 ENCOUNTER — Ambulatory Visit (HOSPITAL_COMMUNITY)
Admission: RE | Admit: 2021-03-25 | Discharge: 2021-03-25 | Disposition: A | Discharge status: Home / Self Care | Payer: Medicare HMO | Source: Ambulatory Visit | Attending: Family Medicine | Admitting: Family Medicine

## 2021-03-25 DIAGNOSIS — J189 Pneumonia, unspecified organism: Secondary | ICD-10-CM

## 2021-04-06 IMAGING — CT CT HEAD W/O CM
3 series · 15 of 47 positions shown, 18 images · non-contrast
Comparison: Carotid Doppler same day.

CLINICAL DATA: Visual disturbance. Slurred speech. Abnormal carotid
Doppler.

EXAM:
CT HEAD WITHOUT CONTRAST
TECHNIQUE: Contiguous axial images were obtained from the base of the skull
through the vertex without intravenous contrast.

[Series 2: head wo · axial · 0.45mm/px · z∈[-117,+13]mm · 9 of 32 slices shown, 12 images]
[im 3/32  brain]
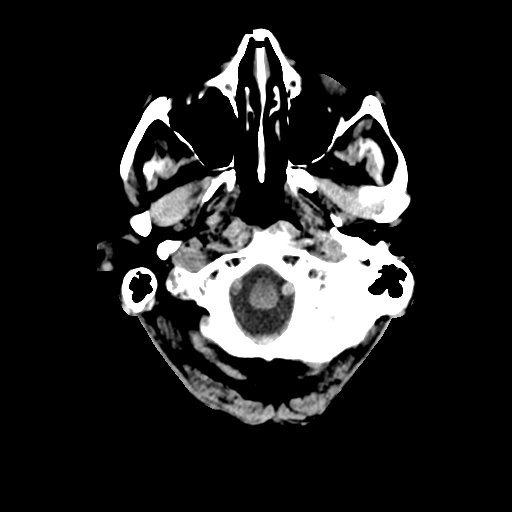
[im 3/32  bone]
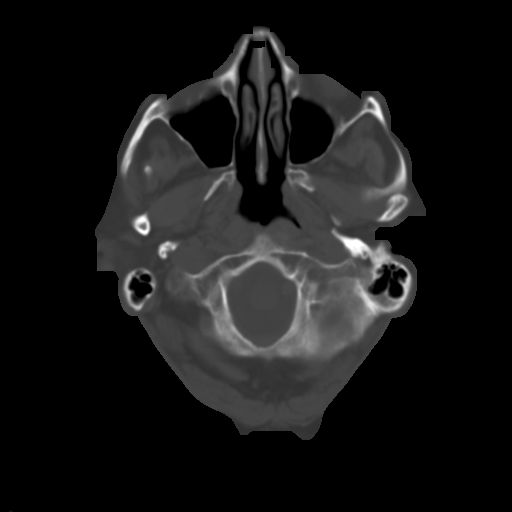
[im 6/32  brain]
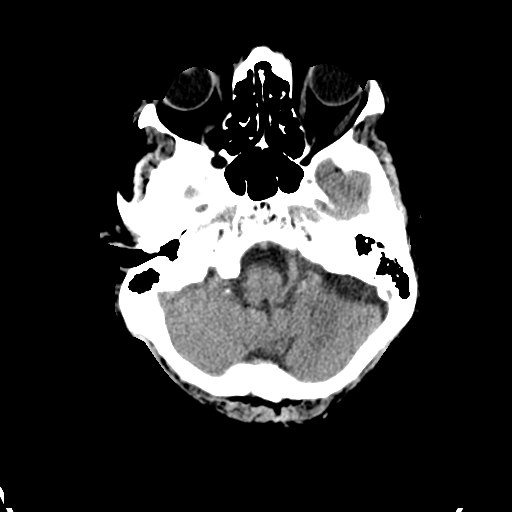
[im 9/32  brain]
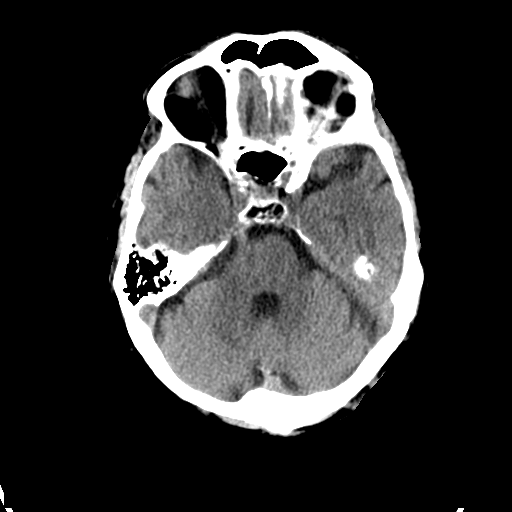
[im 12/32  brain]
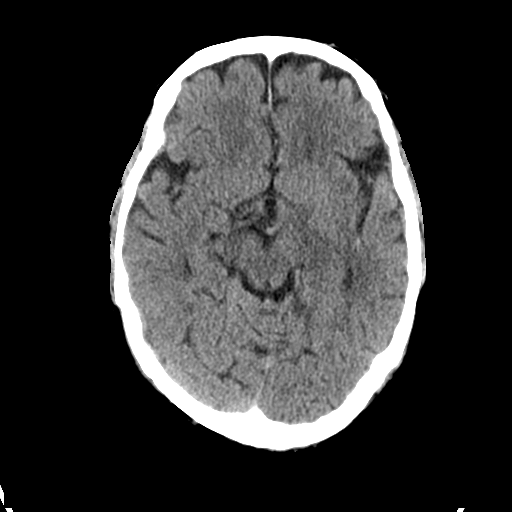
[im 17/32  brain]
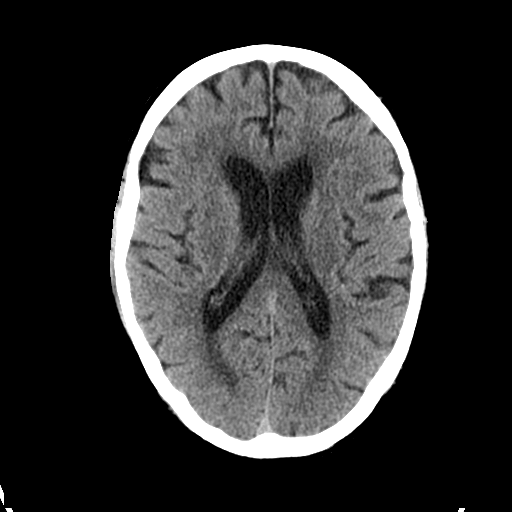
[im 17/32  bone]
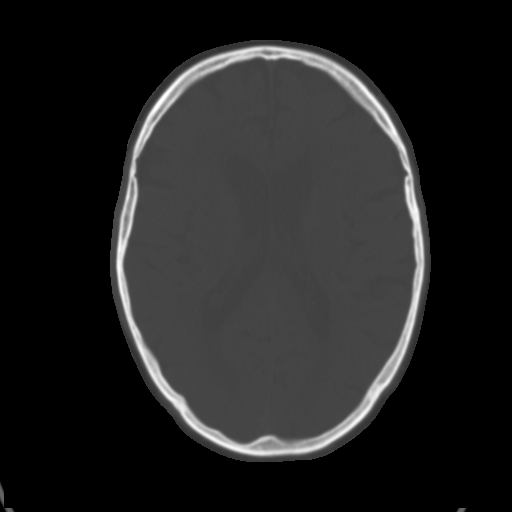
[im 20/32  brain]
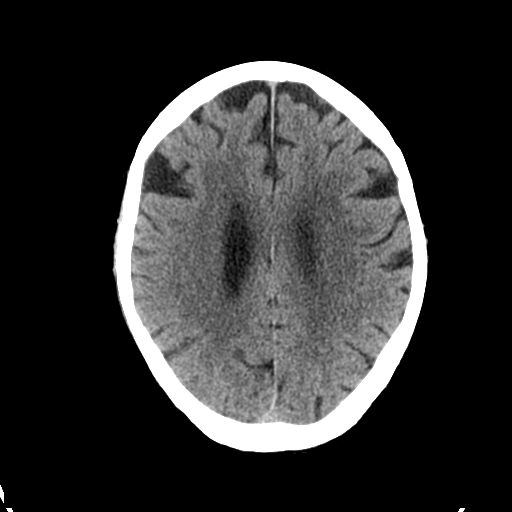
[im 23/32  brain]
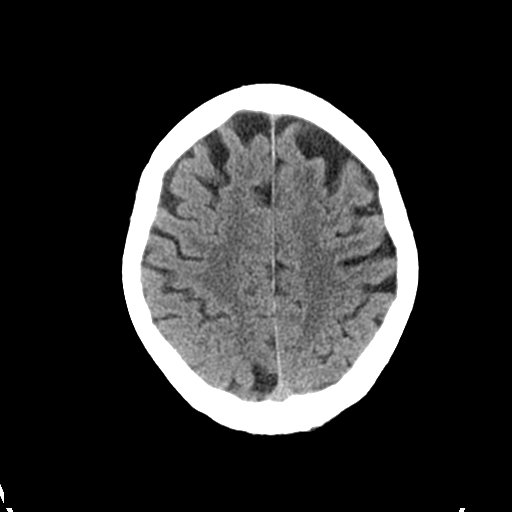
[im 26/32  brain]
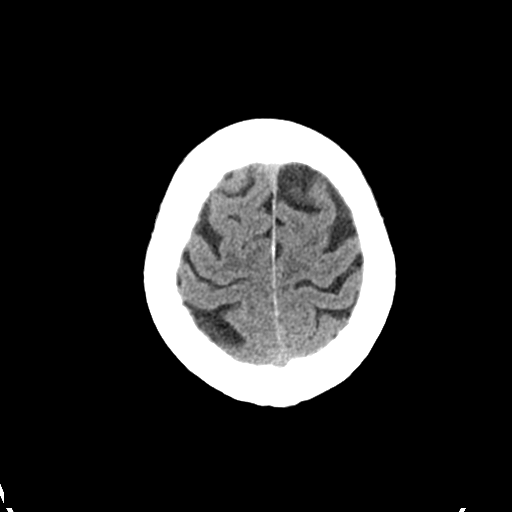
[im 29/32  brain]
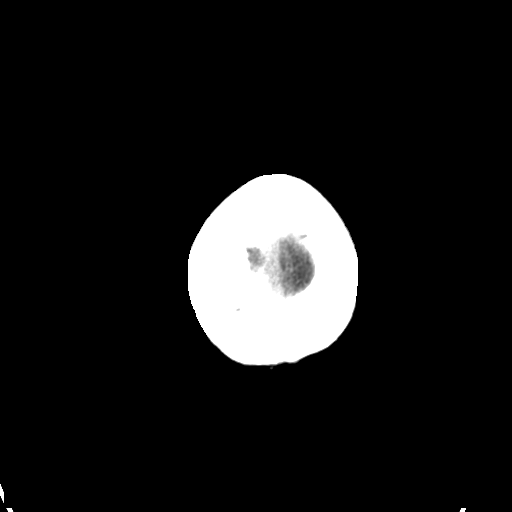
[im 29/32  bone]
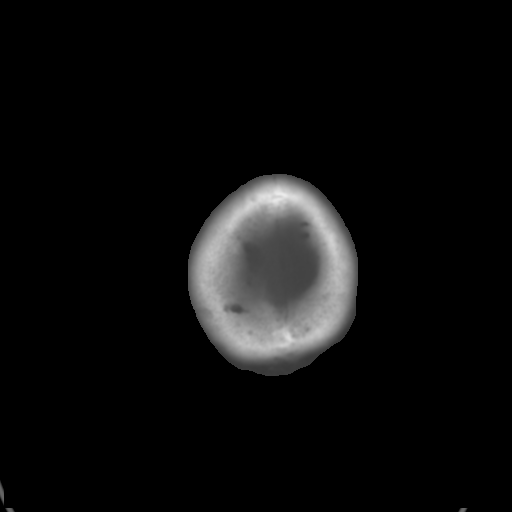

[Series 4: coronal soft tissue · coronal · 0.31mm/px · 3 of 67 slices shown]
[im 23/67  brain]
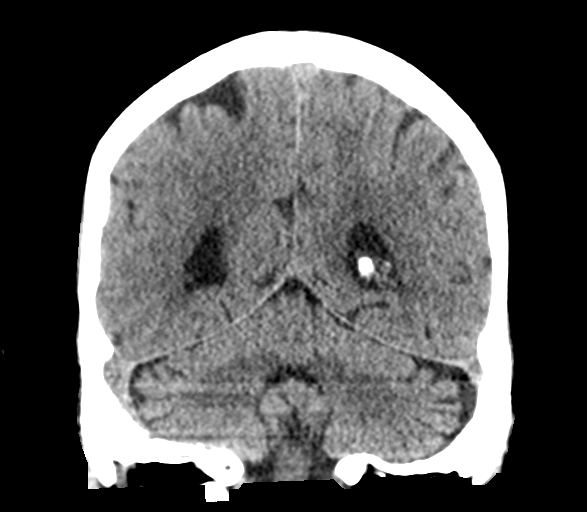
[im 30/67  brain]
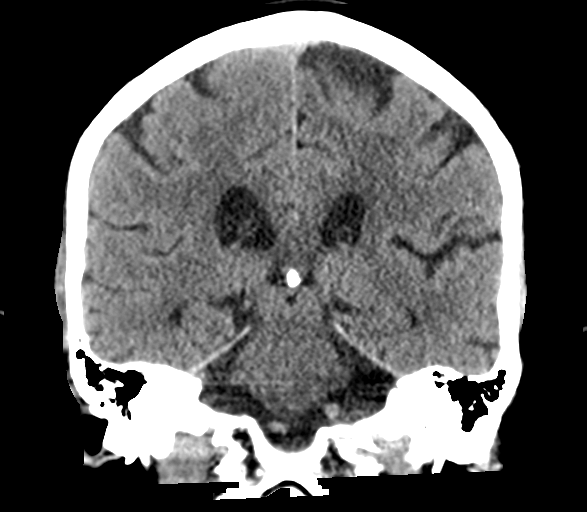
[im 37/67  brain]
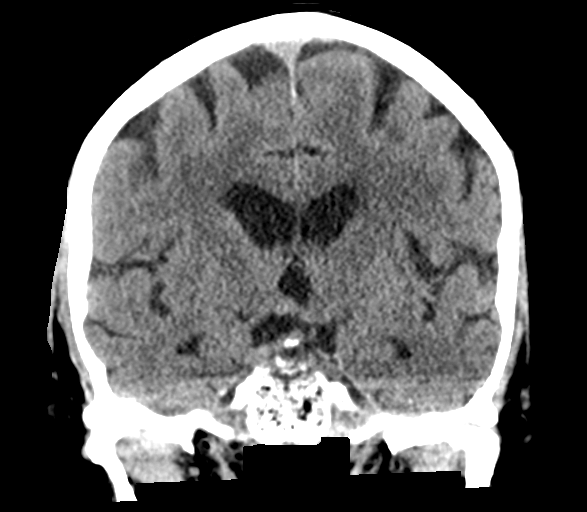

[Series 5: sagittal soft tissue · sagittal · 0.31mm/px · 3 of 54 slices shown]
[im 18/54  brain]
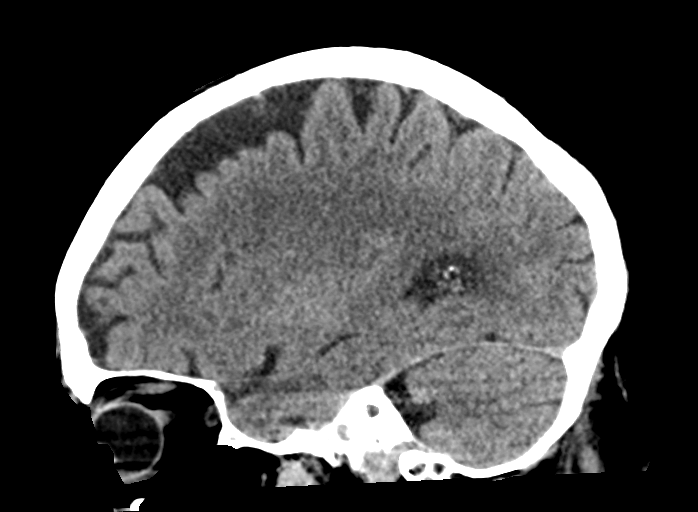
[im 27/54  brain]
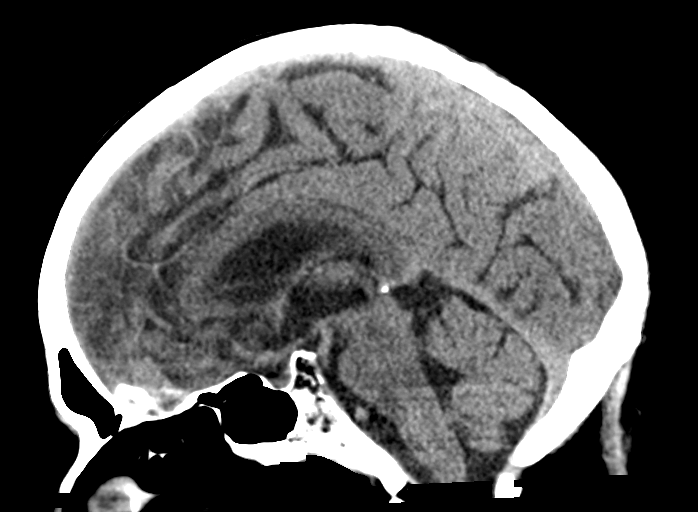
[im 36/54  brain]
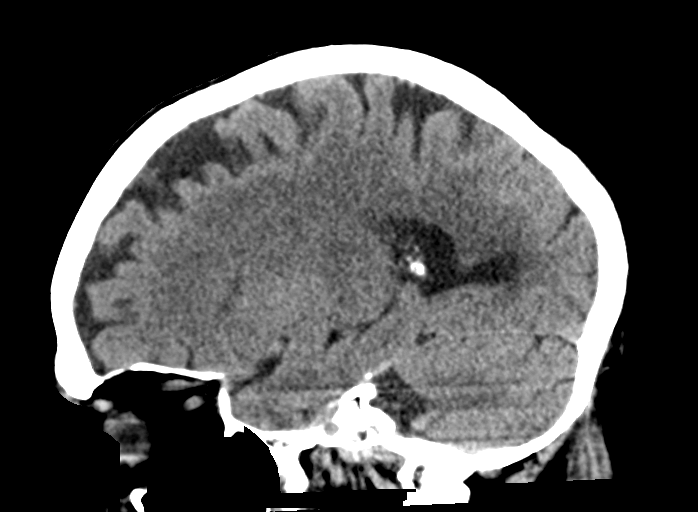

[15 of 47 positions shown; findings below may reference images not displayed]

FINDINGS: Brain: Age related volume loss. No sign of old or acute focal
infarction, mass lesion, hemorrhage, hydrocephalus or extra-axial
collection.

Vascular: There is atherosclerotic calcification of the major
vessels at the base of the brain.

Skull: Negative

Sinuses/Orbits: Clear/normal

Other: None
IMPRESSION: Normal head CT for age.  Mild age related volume loss.

## 2022-05-20 DEATH — deceased

## 2024-03-23 ENCOUNTER — Other Ambulatory Visit: Payer: Self-pay
